# Patient Record
Sex: Male | Born: 1950
Health system: Southern US, Community
[De-identification: ages and names within clinical notes are randomized; demographics above are authoritative.]

## PROBLEM LIST (undated history)

## (undated) DIAGNOSIS — Z86010 Personal history of colonic polyps: Secondary | ICD-10-CM

## (undated) DIAGNOSIS — C801 Malignant (primary) neoplasm, unspecified: Secondary | ICD-10-CM

## (undated) DIAGNOSIS — H269 Unspecified cataract: Secondary | ICD-10-CM

## (undated) DIAGNOSIS — M1990 Unspecified osteoarthritis, unspecified site: Secondary | ICD-10-CM

## (undated) HISTORY — PX: COSMETIC SURGERY: SHX468

## (undated) HISTORY — DX: Unspecified osteoarthritis, unspecified site: M19.90

## (undated) HISTORY — PX: COLONOSCOPY: SHX174

## (undated) HISTORY — DX: Malignant (primary) neoplasm, unspecified: C80.1

## (undated) HISTORY — DX: Unspecified cataract: H26.9

## (undated) HISTORY — PX: FRACTURE SURGERY: SHX138

## (undated) HISTORY — DX: Personal history of colonic polyps: Z86.010

---

## 2001-08-05 ENCOUNTER — Encounter: Payer: Self-pay | Admitting: Internal Medicine

## 2007-06-13 ENCOUNTER — Emergency Department (HOSPITAL_COMMUNITY): Admission: EM | Admit: 2007-06-13 | Discharge: 2007-06-13 | Payer: Self-pay | Admitting: Emergency Medicine

## 2008-07-08 ENCOUNTER — Emergency Department (HOSPITAL_COMMUNITY): Admission: EM | Admit: 2008-07-08 | Discharge: 2008-07-08 | Payer: Self-pay | Admitting: Emergency Medicine

## 2009-06-30 ENCOUNTER — Emergency Department (HOSPITAL_COMMUNITY): Admission: EM | Admit: 2009-06-30 | Discharge: 2009-06-30 | Payer: Self-pay | Admitting: Emergency Medicine

## 2009-07-13 ENCOUNTER — Ambulatory Visit: Payer: Self-pay | Admitting: Internal Medicine

## 2009-07-13 DIAGNOSIS — R5383 Other fatigue: Secondary | ICD-10-CM

## 2009-07-13 DIAGNOSIS — R5381 Other malaise: Secondary | ICD-10-CM | POA: Insufficient documentation

## 2009-07-16 LAB — CONVERTED CEMR LAB
AST: 37 units/L (ref 0–37)
Albumin: 3.7 g/dL (ref 3.5–5.2)
Basophils Absolute: 0 10*3/uL (ref 0.0–0.1)
CO2: 33 meq/L — ABNORMAL HIGH (ref 19–32)
Glucose, Bld: 101 mg/dL — ABNORMAL HIGH (ref 70–99)
HCT: 45.2 % (ref 39.0–52.0)
Hemoglobin: 15.4 g/dL (ref 13.0–17.0)
Lymphs Abs: 1.9 10*3/uL (ref 0.7–4.0)
MCHC: 34.1 g/dL (ref 30.0–36.0)
Monocytes Relative: 9 % (ref 3.0–12.0)
Neutro Abs: 3.1 10*3/uL (ref 1.4–7.7)
Potassium: 4.9 meq/L (ref 3.5–5.1)
RDW: 12.3 % (ref 11.5–14.6)
Sodium: 140 meq/L (ref 135–145)
TSH: 1.48 microintl units/mL (ref 0.35–5.50)
Total Protein: 6.6 g/dL (ref 6.0–8.3)

## 2010-01-04 IMAGING — CT CT HEAD W/O CM
1 of 2 series · 13 of 30 positions shown, 17 images · non-contrast
Comparison: None

CLINICAL DATA: Dizziness.  Weakness.  Slurred speech.

CT HEAD WITHOUT CONTRAST
TECHNIQUE: Contiguous axial images were obtained from the base of
the skull through the vertex without contrast.

[Series 2: brain · axial · 0.47mm/px · z∈[-89,+49]mm · 13 of 32 slices shown, 17 images]
[im 3/32  brain]
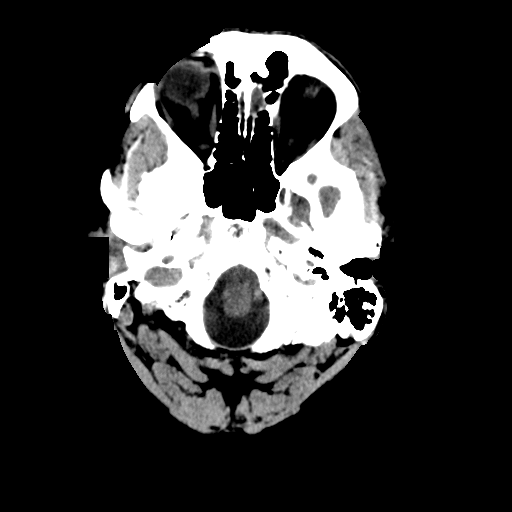
[im 3/32  bone]
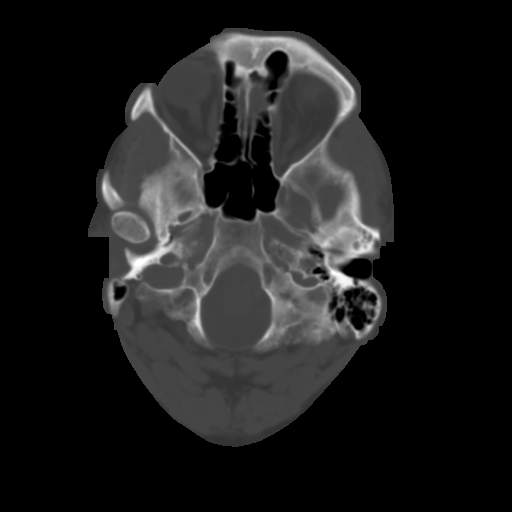
[im 5/32  brain]
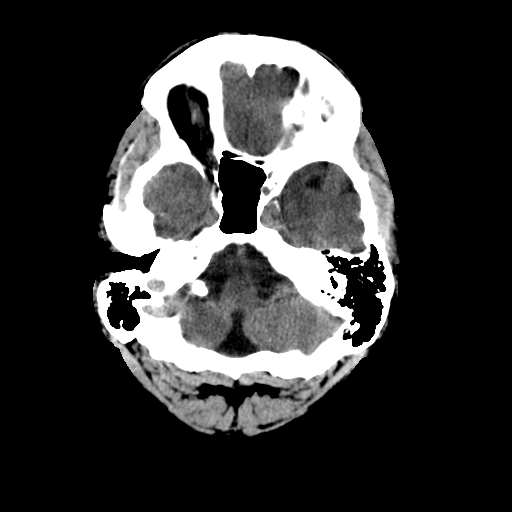
[im 7/32  brain]
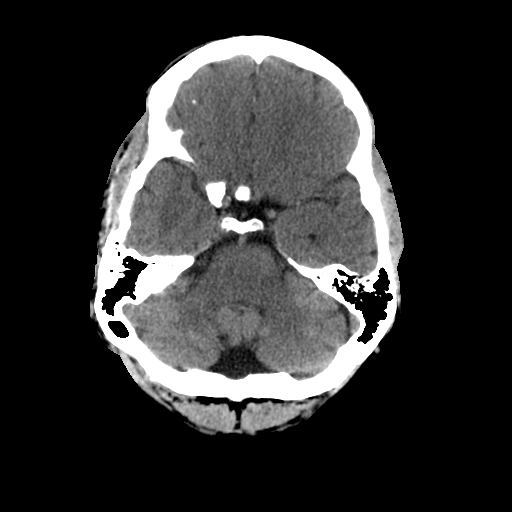
[im 9/32  brain]
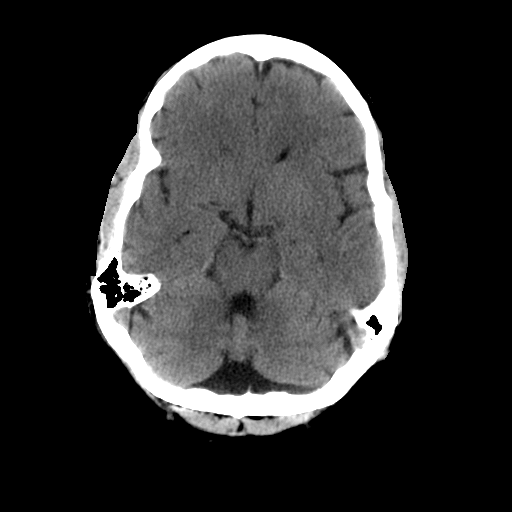
[im 12/32  brain]
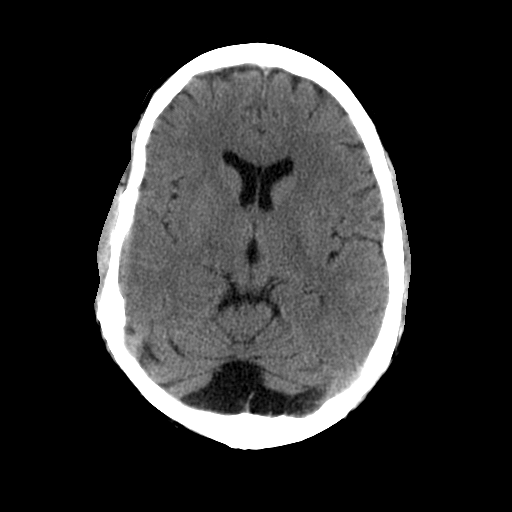
[im 12/32  bone]
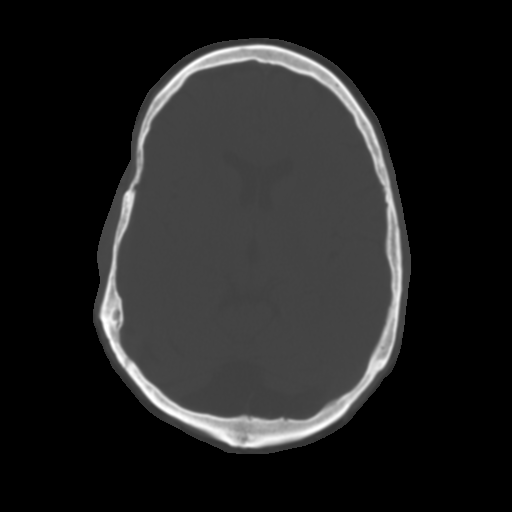
[im 14/32  brain]
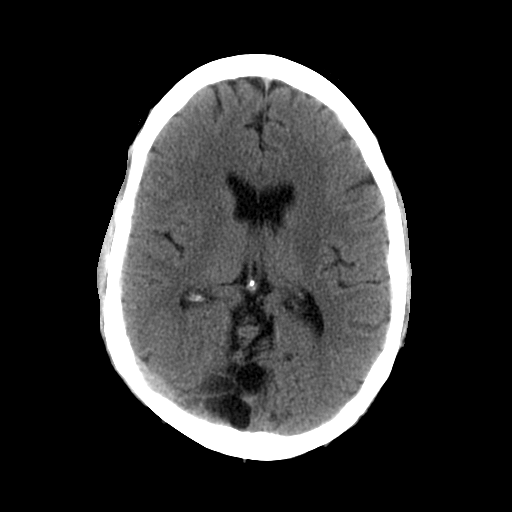
[im 16/32  brain]
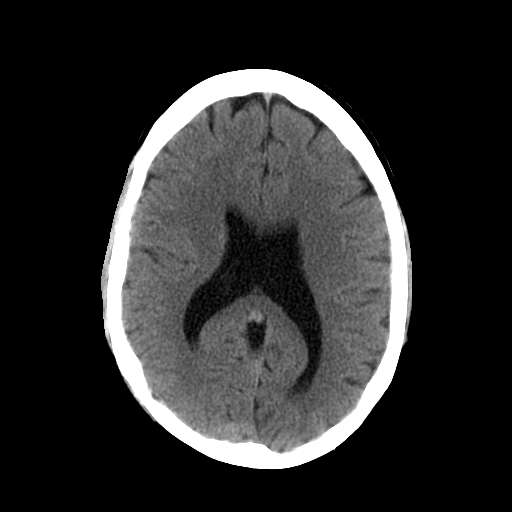
[im 18/32  brain]
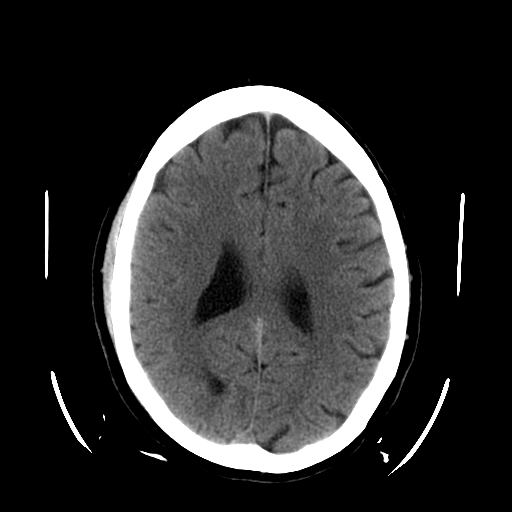
[im 20/32  brain]
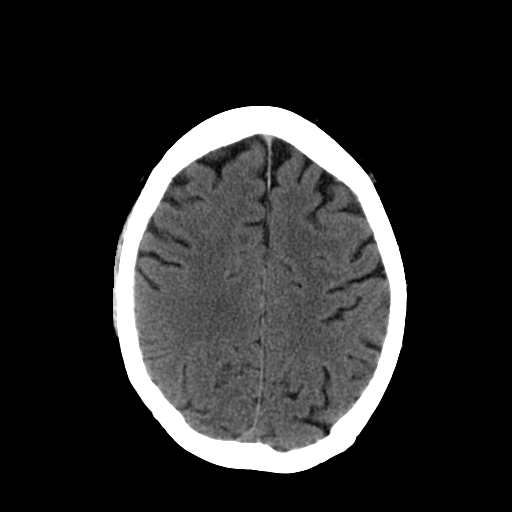
[im 20/32  bone]
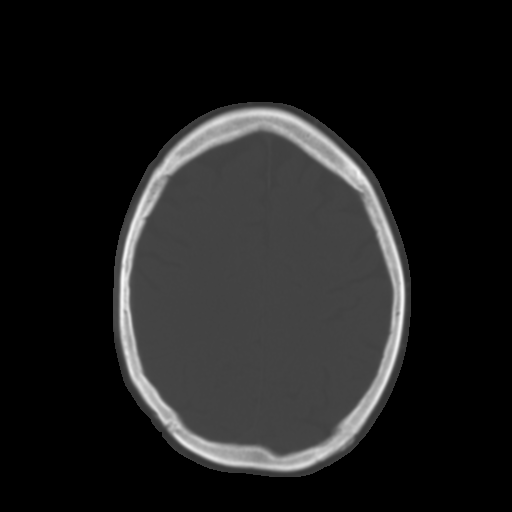
[im 23/32  brain]
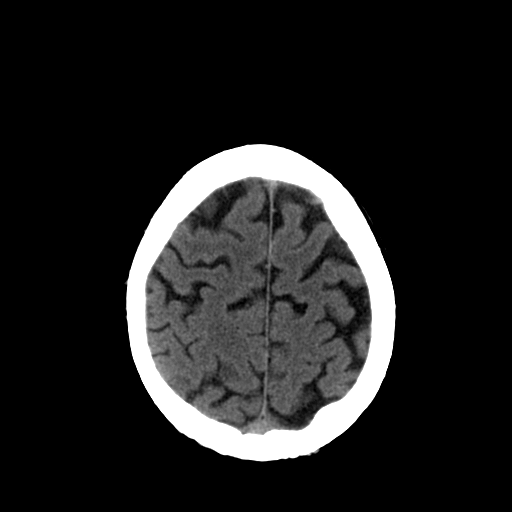
[im 25/32  brain]
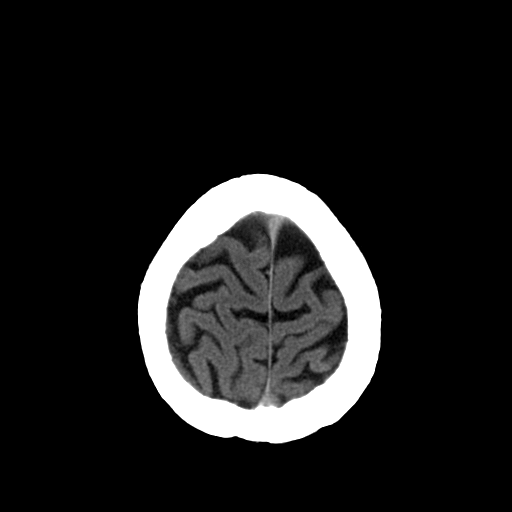
[im 27/32  brain]
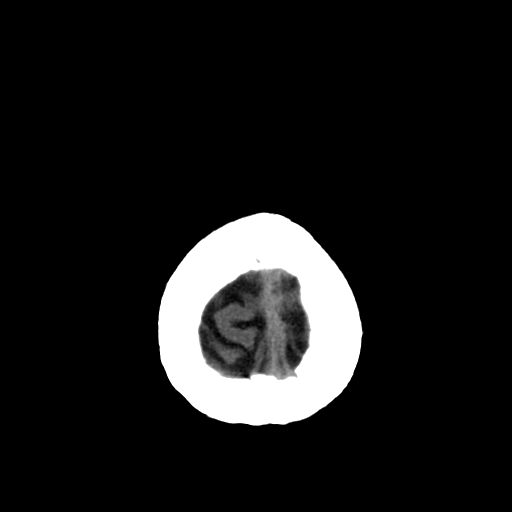
[im 29/32  brain]
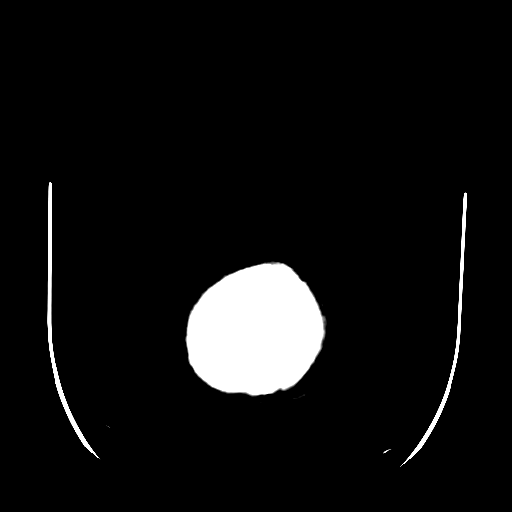
[im 29/32  bone]
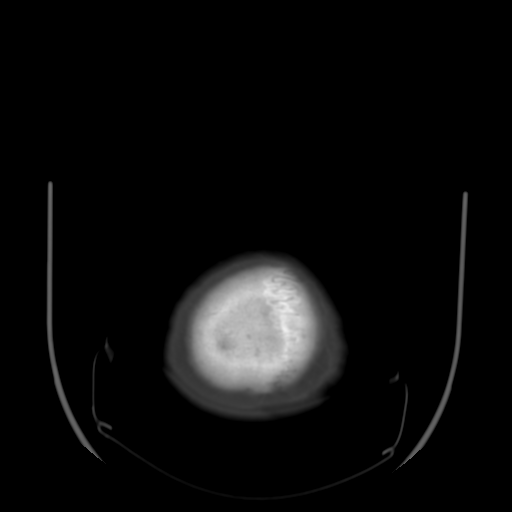

[13 of 30 positions shown; findings below may reference images not displayed]

FINDINGS: There is no evidence of acute infarction, mass lesion,
hemorrhage, hydrocephalus or subdural collection.  The patient has
the congenital variation of a mega cisterna magna, not a
significant finding.  The calvarium is unremarkable.  The sinuses,
middle ears and mastoids are clear.
IMPRESSION: No acute or significant finding.  Examination within normal limits.
See above discussion.

## 2011-01-02 ENCOUNTER — Encounter: Payer: Self-pay | Admitting: Internal Medicine

## 2011-02-08 LAB — POCT I-STAT, CHEM 8
Calcium, Ion: 1.05 mmol/L — ABNORMAL LOW (ref 1.12–1.32)
Glucose, Bld: 176 mg/dL — ABNORMAL HIGH (ref 70–99)
HCT: 42 % (ref 39.0–52.0)
Hemoglobin: 14.3 g/dL (ref 13.0–17.0)
Potassium: 4.7 mEq/L (ref 3.5–5.1)
TCO2: 19 mmol/L (ref 0–100)

## 2011-02-08 LAB — POCT CARDIAC MARKERS
CKMB, poc: 1.6 ng/mL (ref 1.0–8.0)
CKMB, poc: 8.3 ng/mL (ref 1.0–8.0)
Troponin i, poc: 0.06 ng/mL (ref 0.00–0.09)

## 2011-03-18 NOTE — H&P (Signed)
NAME:  CARDALE, DORER NO.:  [id number]   MEDICAL RECORD NO.:  [id number]          PATIENT TYPE:  EMS   LOCATION:  [facility]                         FACILITY:  [facility]   PHYSICIAN:  Valetta Mole. Swords, MD    DATE OF BIRTH:  [birth year]   DATE OF ADMISSION:  06/30/2009  DATE OF DISCHARGE:  06/30/2009                              HISTORY & PHYSICAL   Mr. Delpilar is a 60 year old male while running a 5K today initially felt  well.  At the end of the race, he felt like his legs just quit working.  He fell down and scraped his right knee.  More importantly, his wife and  he noted that his speech was somewhat slurred like my tongue was  thick.  Also admits that his memory was fussy but no loss of  consciousness.  Wife reports left facial drooping.  He does remember  feeling poorly towards the end of the race.  He remembers feeling very  hot.  He does not remember sweating excessively.  He does admit that  this was twice as far as his usual run.   PAST MEDICAL HISTORY:  Unremarkable.   SURGERY:  Nasal surgery.   SOCIAL HISTORY:  He is nonsmoker, married, alcohol 1 glass of wine per  night.  He does exercise regularly.   FAMILY HISTORY:  Mother alive and well, may have had a TIA 10 years ago.  Father deceased from respiratory failure.   REVIEW OF SYSTEMS:  Completely normal except the above and a 15-point  review of systems.   PHYSICAL EXAMINATION:  VITAL SIGNS:  Temperature 98, blood pressure  121/63, heart rate 54, respirations 16.  GENERAL:  He appears well-developed, well-nourished male, in no acute  distress.  HEENT:  Atraumatic, normocephalic.  Extraocular muscles are intact.  Pupils are equally round and reactive to light.  Cranial nerves are  intact.  NECK:  Supple without lymphadenopathy, thyromegaly, jugular venous  distention, or carotid bruits.  CHEST:  Clear to auscultation without any increased work of breathing.  CARDIAC:  S1 and S2 are normal without murmurs  or gallops.  ABDOMEN:  Active bowel sounds, soft, and nontender.  There is no  thyromegaly.  No masses are palpated.  EXTREMITIES:  There is no clubbing.  NEUROLOGIC:  He is alert and oriented without any motor or sensory  deficits.  Deep tendon reflexes are normal in the upper and lower  extremities.  Strength is normal throughout.  Gait is normal.   LABORATORIES:  Cardiac markers normal.  Sodium was 133.  Ionized calcium  1.05, otherwise, I-STAT Chem-8 is normal.  CT of the head demonstrates  no acute finding.  Examination is within normal limits.   ASSESSMENT AND PLAN:  He has had some sort of transient neurologic  event.  I doubt this is transient ischemic attacks.  I suspect this is  more likely related to heat exhaustion.  I think he can go home, but we  will feel more comfortable after he has an MRI/MRA of the brain.  I  discussed with Radiology, they can  do that now.  If this is  unremarkable, he can be discharged home.  Follow up with me in 1-2  weeks.  He is advised to take an aspirin daily.      Bruce Rexene Edison Swords, MD  Electronically Signed     [facility]  D:  06/30/2009  T:  07/01/2009  Job:  [id number]

## 2012-02-02 ENCOUNTER — Ambulatory Visit: Payer: Self-pay | Admitting: Internal Medicine

## 2013-08-19 ENCOUNTER — Telehealth: Payer: Self-pay | Admitting: Family Medicine

## 2013-08-19 NOTE — Telephone Encounter (Signed)
Message copied by Drusilla Kanner on Fri Aug 19, 2013  5:02 PM ------      Message from: Lindley Magnus      Created: Tue Mar 01, 2013  8:02 AM      Regarding: RE: Request to Re-establish      Contact: [phone]       Danielle Dess      ----- Message -----         From: Drusilla Kanner         Sent: 02/25/2013   7:05 AM           To: Lindley Magnus, MD      Subject: Request to Re-establish                                  Pt last seen over 4 years ago and is requesting to re-establish.  I explained that you are currently not accepting new patients at this time due to your reduced work schedule.  Pt requested a message be sent to you asking if you would make an exception.  If not pt requests a recommendation from you on who he should see.            Thanks      [place]       ------

## 2013-08-19 NOTE — Telephone Encounter (Signed)
Pt has an appt to establish with Dr. Caryl Never on 10/13/13.  However, needs a colonoscopy before the end of the year and pt knows that it can take several months to get in with a specialist.  Please advise if ok to order referral for colonoscopy prior to appt to re-establish.

## 2013-08-21 NOTE — Telephone Encounter (Signed)
I'm OK to refer but my only concern is whether insurance would deny referral if he is consider not established.

## 2013-08-24 ENCOUNTER — Other Ambulatory Visit: Payer: Self-pay | Admitting: Family Medicine

## 2013-08-24 DIAGNOSIS — Z0000 Encounter for general adult medical examination without abnormal findings: Secondary | ICD-10-CM

## 2013-08-24 NOTE — Telephone Encounter (Signed)
Order is placed.

## 2013-08-24 NOTE — Telephone Encounter (Signed)
Will notify pt, Montrice please enter order for referral

## 2013-09-05 ENCOUNTER — Telehealth: Payer: Self-pay | Admitting: Family Medicine

## 2013-09-05 NOTE — Telephone Encounter (Signed)
I dont see any medications on the patient list. And i don't see where he even saw Dr. Cato Mulligan. Is it okay to send a RX for a EpiPen for the patient

## 2013-09-05 NOTE — Telephone Encounter (Signed)
Go ahead and give one rx for Epipen 0.3 mg use as directed prn and make sure he keeps appt in December.

## 2013-09-05 NOTE — Telephone Encounter (Signed)
Left message for Consuella Lose to give a call back to the office

## 2013-09-05 NOTE — Telephone Encounter (Signed)
Pt will be new pt in dec. Over 3 yrs since seen dr swords. Pt needs rx for epipen. 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 pls advise Pharm: [facility]/ [address]

## 2013-09-06 MED ORDER — EPINEPHRINE 0.3 MG/0.3ML IJ SOAJ: 0.3000 mg | Freq: Once | INTRAMUSCULAR | Status: AC

## 2013-09-06 NOTE — Telephone Encounter (Signed)
Sent RX to pharmacy 

## 2013-10-07 ENCOUNTER — Other Ambulatory Visit (INDEPENDENT_AMBULATORY_CARE_PROVIDER_SITE_OTHER): Payer: BC Managed Care – PPO

## 2013-10-07 DIAGNOSIS — Z0000 Encounter for general adult medical examination without abnormal findings: Secondary | ICD-10-CM

## 2013-10-07 LAB — CBC WITH DIFFERENTIAL/PLATELET
Basophils Relative: 1 % (ref 0.0–3.0)
Eosinophils Relative: 5.1 % — ABNORMAL HIGH (ref 0.0–5.0)
HCT: 45.5 % (ref 39.0–52.0)
Hemoglobin: 15.8 g/dL (ref 13.0–17.0)
Lymphs Abs: 2.1 10*3/uL (ref 0.7–4.0)
MCV: 91.9 fl (ref 78.0–100.0)
Monocytes Absolute: 1 10*3/uL (ref 0.1–1.0)
Monocytes Relative: 15.7 % — ABNORMAL HIGH (ref 3.0–12.0)
RBC: 4.95 Mil/uL (ref 4.22–5.81)
WBC: 6.6 10*3/uL (ref 4.5–10.5)

## 2013-10-07 LAB — BASIC METABOLIC PANEL
Chloride: 101 mEq/L (ref 96–112)
GFR: 78.16 mL/min (ref >60.00)
Potassium: 5.3 mEq/L — ABNORMAL HIGH (ref 3.5–5.1)
Sodium: 136 mEq/L (ref 135–145)

## 2013-10-07 LAB — LIPID PANEL
HDL: 51.9 mg/dL (ref >39.00)
LDL Cholesterol: 113 mg/dL — ABNORMAL HIGH (ref 0–99)
Total CHOL/HDL Ratio: 4
Triglycerides: 159 mg/dL — ABNORMAL HIGH (ref 0.0–149.0)

## 2013-10-07 LAB — TSH: TSH: 2.09 u[IU]/mL (ref 0.35–5.50)

## 2013-10-07 LAB — POCT URINALYSIS DIPSTICK
Bilirubin, UA: NEGATIVE
Blood, UA: NEGATIVE
Glucose, UA: NEGATIVE
Nitrite, UA: NEGATIVE
Spec Grav, UA: 1.015
Urobilinogen, UA: 0.2
pH, UA: 7

## 2013-10-07 LAB — HEPATIC FUNCTION PANEL
ALT: 30 U/L (ref 0–53)
AST: 27 U/L (ref 0–37)
Total Protein: 6.5 g/dL (ref 6.0–8.3)

## 2013-10-07 LAB — PSA: PSA: 0.73 ng/mL (ref 0.10–4.00)

## 2013-10-13 ENCOUNTER — Encounter: Payer: Self-pay | Admitting: Family Medicine

## 2013-10-13 ENCOUNTER — Ambulatory Visit (INDEPENDENT_AMBULATORY_CARE_PROVIDER_SITE_OTHER): Payer: BC Managed Care – PPO | Admitting: Family Medicine

## 2013-10-13 VITALS — BP 154/80 | HR 69 | Temp 98.4°F | Wt 210.0 lb

## 2013-10-13 DIAGNOSIS — Z23 Encounter for immunization: Secondary | ICD-10-CM

## 2013-10-13 DIAGNOSIS — Z0000 Encounter for general adult medical examination without abnormal findings: Secondary | ICD-10-CM

## 2013-10-13 MED ORDER — TERBINAFINE HCL 250 MG PO TABS: 250.0000 mg | ORAL_TABLET | Freq: Every day | ORAL | Status: DC

## 2013-10-13 NOTE — Progress Notes (Signed)
Pre visit review using our clinic review tool, if applicable. No additional management support is needed unless otherwise documented below in the visit note. 

## 2013-10-13 NOTE — Patient Instructions (Signed)
Hypertriglyceridemia  Diet for High blood levels of Triglycerides Most fats in food are triglycerides. Triglycerides in your blood are stored as fat in your body. High levels of triglycerides in your blood may put you at a greater risk for heart disease and stroke.  Normal triglyceride levels are less than 150 mg/dL. Borderline high levels are 150-199 mg/dl. High levels are 200 - 499 mg/dL, and very high triglyceride levels are greater than 500 mg/dL. The decision to treat high triglycerides is generally based on the level. For people with borderline or high triglyceride levels, treatment includes weight loss and exercise. Drugs are recommended for people with very high triglyceride levels. Many people who need treatment for high triglyceride levels have metabolic syndrome. This syndrome is a collection of disorders that often include: insulin resistance, high blood pressure, blood clotting problems, high cholesterol and triglycerides. TESTING PROCEDURE FOR TRIGLYCERIDES  You should not eat 4 hours before getting your triglycerides measured. The normal range of triglycerides is between 10 and 250 milligrams per deciliter (mg/dl). Some people may have extreme levels (1000 or above), but your triglyceride level may be too high if it is above 150 mg/dl, depending on what other risk factors you have for heart disease.  People with high blood triglycerides may also have high blood cholesterol levels. If you have high blood cholesterol as well as high blood triglycerides, your risk for heart disease is probably greater than if you only had high triglycerides. High blood cholesterol is one of the main risk factors for heart disease. CHANGING YOUR DIET  Your weight can affect your blood triglyceride level. If you are more than 20% above your ideal body weight, you may be able to lower your blood triglycerides by losing weight. Eating less and exercising regularly is the best way to combat this. Fat provides more  calories than any other food. The best way to lose weight is to eat less fat. Only 30% of your total calories should come from fat. Less than 7% of your diet should come from saturated fat. A diet low in fat and saturated fat is the same as a diet to decrease blood cholesterol. By eating a diet lower in fat, you may lose weight, lower your blood cholesterol, and lower your blood triglyceride level.  Eating a diet low in fat, especially saturated fat, may also help you lower your blood triglyceride level. Ask your dietitian to help you figure how much fat you can eat based on the number of calories your caregiver has prescribed for you.  Exercise, in addition to helping with weight loss may also help lower triglyceride levels.   Alcohol can increase blood triglycerides. You may need to stop drinking alcoholic beverages.  Too much carbohydrate in your diet may also increase your blood triglycerides. Some complex carbohydrates are necessary in your diet. These may include bread, rice, potatoes, other starchy vegetables and cereals.  Reduce "simple" carbohydrates. These may include pure sugars, candy, honey, and jelly without losing other nutrients. If you have the kind of high blood triglycerides that is affected by the amount of carbohydrates in your diet, you will need to eat less sugar and less high-sugar foods. Your caregiver can help you with this.  Adding 2-4 grams of fish oil (EPA+ DHA) may also help lower triglycerides. Speak with your caregiver before adding any supplements to your regimen. Following the Diet  Maintain your ideal weight. Your caregivers can help you with a diet. Generally, eating less food and getting more   exercise will help you lose weight. Joining a weight control group may also help. Ask your caregivers for a good weight control group in your area.  Eat low-fat foods instead of high-fat foods. This can help you lose weight too.  These foods are lower in fat. Eat MORE of these:    Dried beans, peas, and lentils.  Egg whites.  Low-fat cottage cheese.  Fish.  Lean cuts of meat, such as round, sirloin, rump, and flank (cut extra fat off meat you fix).  Whole grain breads, cereals and pasta.  Skim and nonfat dry milk.  Low-fat yogurt.  Poultry without the skin.  Cheese made with skim or part-skim milk, such as mozzarella, parmesan, farmers', ricotta, or pot cheese. These are higher fat foods. Eat LESS of these:   Whole milk and foods made from whole milk, such as American, blue, cheddar, monterey jack, and swiss cheese  High-fat meats, such as luncheon meats, sausages, knockwurst, bratwurst, hot dogs, ribs, corned beef, ground pork, and regular ground beef.  Fried foods. Limit saturated fats in your diet. Substituting unsaturated fat for saturated fat may decrease your blood triglyceride level. You will need to read package labels to know which products contain saturated fats.  These foods are high in saturated fat. Eat LESS of these:   Fried pork skins.  Whole milk.  Skin and fat from poultry.  Palm oil.  Butter.  Shortening.  Cream cheese.  Bacon.  Margarines and baked goods made from listed oils.  Vegetable shortenings.  Chitterlings.  Fat from meats.  Coconut oil.  Palm kernel oil.  Lard.  Cream.  Sour cream.  Fatback.  Coffee whiteners and non-dairy creamers made with these oils.  Cheese made from whole milk. Use unsaturated fats (both polyunsaturated and monounsaturated) moderately. Remember, even though unsaturated fats are better than saturated fats; you still want a diet low in total fat.  These foods are high in unsaturated fat:   Canola oil.  Sunflower oil.  Mayonnaise.  Almonds.  Peanuts.  Pine nuts.  Margarines made with these oils.  Safflower oil.  Olive oil.  Avocados.  Cashews.  Peanut butter.  Sunflower seeds.  Soybean oil.  Peanut  oil.  Olives.  Pecans.  Walnuts.  Pumpkin seeds. Avoid sugar and other high-sugar foods. This will decrease carbohydrates without decreasing other nutrients. Sugar in your food goes rapidly to your blood. When there is excess sugar in your blood, your liver may use it to make more triglycerides. Sugar also contains calories without other important nutrients.  Eat LESS of these:   Sugar, brown sugar, powdered sugar, jam, jelly, preserves, honey, syrup, molasses, pies, candy, cakes, cookies, frosting, pastries, colas, soft drinks, punches, fruit drinks, and regular gelatin.  Avoid alcohol. Alcohol, even more than sugar, may increase blood triglycerides. In addition, alcohol is high in calories and low in nutrients. Ask for sparkling water, or a diet soft drink instead of an alcoholic beverage. Suggestions for planning and preparing meals   Bake, broil, grill or roast meats instead of frying.  Remove fat from meats and skin from poultry before cooking.  Add spices, herbs, lemon juice or vinegar to vegetables instead of salt, rich sauces or gravies.  Use a non-stick skillet without fat or use no-stick sprays.  Cool and refrigerate stews and broth. Then remove the hardened fat floating on the surface before serving.  Refrigerate meat drippings and skim off fat to make low-fat gravies.  Serve more fish.  Use less butter,   margarine and other high-fat spreads on bread or vegetables.  Use skim or reconstituted non-fat dry milk for cooking.  Cook with low-fat cheeses.  Substitute low-fat yogurt or cottage cheese for all or part of the sour cream in recipes for sauces, dips or congealed salads.  Use half yogurt/half mayonnaise in salad recipes.  Substitute evaporated skim milk for cream. Evaporated skim milk or reconstituted non-fat dry milk can be whipped and substituted for whipped cream in certain recipes.  Choose fresh fruits for dessert instead of high-fat foods such as pies or  cakes. Fruits are naturally low in fat. When Dining Out   Order low-fat appetizers such as fruit or vegetable juice, pasta with vegetables or tomato sauce.  Select clear, rather than cream soups.  Ask that dressings and gravies be served on the side. Then use less of them.  Order foods that are baked, broiled, poached, steamed, stir-fried, or roasted.  Ask for margarine instead of butter, and use only a small amount.  Drink sparkling water, unsweetened tea or coffee, or diet soft drinks instead of alcohol or other sweet beverages. QUESTIONS AND ANSWERS ABOUT OTHER FATS IN THE BLOOD: SATURATED FAT, TRANS FAT, AND CHOLESTEROL What is trans fat? Trans fat is a type of fat that is formed when vegetable oil is hardened through a process called hydrogenation. This process helps makes foods more solid, gives them shape, and prolongs their shelf life. Trans fats are also called hydrogenated or partially hydrogenated oils.  What do saturated fat, trans fat, and cholesterol in foods have to do with heart disease? Saturated fat, trans fat, and cholesterol in the diet all raise the level of LDL "bad" cholesterol in the blood. The higher the LDL cholesterol, the greater the risk for coronary heart disease (CHD). Saturated fat and trans fat raise LDL similarly.  What foods contain saturated fat, trans fat, and cholesterol? High amounts of saturated fat are found in animal products, such as fatty cuts of meat, chicken skin, and full-fat dairy products like butter, whole milk, cream, and cheese, and in tropical vegetable oils such as palm, palm kernel, and coconut oil. Trans fat is found in some of the same foods as saturated fat, such as vegetable shortening, some margarines (especially hard or stick margarine), crackers, cookies, baked goods, fried foods, salad dressings, and other processed foods made with partially hydrogenated vegetable oils. Small amounts of trans fat also occur naturally in some animal  products, such as milk products, beef, and lamb. Foods high in cholesterol include liver, other organ meats, egg yolks, shrimp, and full-fat dairy products. How can I use the new food label to make heart-healthy food choices? Check the Nutrition Facts panel of the food label. Choose foods lower in saturated fat, trans fat, and cholesterol. For saturated fat and cholesterol, you can also use the Percent Daily Value (%DV): 5% DV or less is low, and 20% DV or more is high. (There is no %DV for trans fat.) Use the Nutrition Facts panel to choose foods low in saturated fat and cholesterol, and if the trans fat is not listed, read the ingredients and limit products that list shortening or hydrogenated or partially hydrogenated vegetable oil, which tend to be high in trans fat. POINTS TO REMEMBER:   Discuss your risk for heart disease with your caregivers, and take steps to reduce risk factors.  Change your diet. Choose foods that are low in saturated fat, trans fat, and cholesterol.  Add exercise to your daily routine if   it is not already being done. Participate in physical activity of moderate intensity, like brisk walking, for at least 30 minutes on most, and preferably all days of the week. No time? Break the 30 minutes into three, 10-minute segments during the day.  Stop smoking. If you do smoke, contact your caregiver to discuss ways in which they can help you quit.  Do not use street drugs.  Maintain a normal weight.  Maintain a healthy blood pressure.  Keep up with your blood work for checking the fats in your blood as directed by your caregiver. Document Released: 08/07/2004 Document Revised: 04/20/2012 Document Reviewed: 03/05/2009 [facility] Patient Information 2014 [place]. Fat and Cholesterol Control Diet Fat and cholesterol levels in your blood and organs are influenced by your diet. High levels of fat and cholesterol may lead to diseases of the heart, small and large blood  vessels, gallbladder, liver, and pancreas. CONTROLLING FAT AND CHOLESTEROL WITH DIET Although exercise and lifestyle factors are important, your diet is key. That is because certain foods are known to raise cholesterol and others to lower it. The goal is to balance foods for their effect on cholesterol and more importantly, to replace saturated and trans fat with other types of fat, such as monounsaturated fat, polyunsaturated fat, and omega-3 fatty acids. On average, a person should consume no more than 15 to 17 g of saturated fat daily. Saturated and trans fats are considered "bad" fats, and they will raise LDL cholesterol. Saturated fats are primarily found in animal products such as meats, butter, and cream. However, that does not mean you need to give up all your favorite foods. Today, there are good tasting, low-fat, low-cholesterol substitutes for most of the things you like to eat. Choose low-fat or nonfat alternatives. Choose round or loin cuts of red meat. These types of cuts are lowest in fat and cholesterol. Chicken (without the skin), fish, veal, and ground [place] breast are great choices. Eliminate fatty meats, such as hot dogs and salami. Even shellfish have little or no saturated fat. Have a 3 oz (85 g) portion when you eat lean meat, poultry, or fish. Trans fats are also called "partially hydrogenated oils." They are oils that have been scientifically manipulated so that they are solid at room temperature resulting in a longer shelf life and improved taste and texture of foods in which they are added. Trans fats are found in stick margarine, some tub margarines, cookies, crackers, and baked goods.  When baking and cooking, oils are a great substitute for butter. The monounsaturated oils are especially beneficial since it is believed they lower LDL and raise HDL. The oils you should avoid entirely are saturated tropical oils, such as coconut and palm.  Remember to eat a lot from food groups  that are naturally free of saturated and trans fat, including fish, fruit, vegetables, beans, grains (barley, rice, couscous, bulgur wheat), and pasta (without cream sauces).  IDENTIFYING FOODS THAT LOWER FAT AND CHOLESTEROL  Soluble fiber may lower your cholesterol. This type of fiber is found in fruits such as apples, vegetables such as broccoli, potatoes, and carrots, legumes such as beans, peas, and lentils, and grains such as barley. Foods fortified with plant sterols (phytosterol) may also lower cholesterol. You should eat at least 2 g per day of these foods for a cholesterol lowering effect.  Read package labels to identify low-saturated fats, trans fat free, and low-fat foods at the supermarket. Select cheeses that have only 2 to 3 g  saturated fat per ounce. Use a heart-healthy tub margarine that is free of trans fats or partially hydrogenated oil. When buying baked goods (cookies, crackers), avoid partially hydrogenated oils. Breads and muffins should be made from whole grains (whole-wheat or whole oat flour, instead of "flour" or "enriched flour"). Buy non-creamy canned soups with reduced salt and no added fats.  FOOD PREPARATION TECHNIQUES  Never deep-fry. If you must fry, either stir-fry, which uses very little fat, or use non-stick cooking sprays. When possible, broil, bake, or roast meats, and steam vegetables. Instead of putting butter or margarine on vegetables, use lemon and herbs, applesauce, and cinnamon (for squash and sweet potatoes). Use nonfat yogurt, salsa, and low-fat dressings for salads.  LOW-SATURATED FAT / LOW-FAT FOOD SUBSTITUTES Meats / Saturated Fat (g)  Avoid: Steak, marbled (3 oz/85 g) / 11 g  Choose: Steak, lean (3 oz/85 g) / 4 g  Avoid: Hamburger (3 oz/85 g) / 7 g  Choose: Hamburger, lean (3 oz/85 g) / 5 g  Avoid: Ham (3 oz/85 g) / 6 g  Choose: Ham, lean cut (3 oz/85 g) / 2.4 g  Avoid: Chicken, with skin, dark meat (3 oz/85 g) / 4 g  Choose: Chicken, skin  removed, dark meat (3 oz/85 g) / 2 g  Avoid: Chicken, with skin, light meat (3 oz/85 g) / 2.5 g  Choose: Chicken, skin removed, light meat (3 oz/85 g) / 1 g Dairy / Saturated Fat (g)  Avoid: Whole milk (1 cup) / 5 g  Choose: Low-fat milk, 2% (1 cup) / 3 g  Choose: Low-fat milk, 1% (1 cup) / 1.5 g  Choose: Skim milk (1 cup) / 0.3 g  Avoid: Hard cheese (1 oz/28 g) / 6 g  Choose: Skim milk cheese (1 oz/28 g) / 2 to 3 g  Avoid: Cottage cheese, 4% fat (1 cup) / 6.5 g  Choose: Low-fat cottage cheese, 1% fat (1 cup) / 1.5 g  Avoid: Ice cream (1 cup) / 9 g  Choose: Sherbet (1 cup) / 2.5 g  Choose: Nonfat frozen yogurt (1 cup) / 0.3 g  Choose: Frozen fruit bar / trace  Avoid: Whipped cream (1 tbs) / 3.5 g  Choose: Nondairy whipped topping (1 tbs) / 1 g Condiments / Saturated Fat (g)  Avoid: Mayonnaise (1 tbs) / 2 g  Choose: Low-fat mayonnaise (1 tbs) / 1 g  Avoid: Butter (1 tbs) / 7 g  Choose: Extra light margarine (1 tbs) / 1 g  Avoid: Coconut oil (1 tbs) / 11.8 g  Choose: Olive oil (1 tbs) / 1.8 g  Choose: Corn oil (1 tbs) / 1.7 g  Choose: Safflower oil (1 tbs) / 1.2 g  Choose: Sunflower oil (1 tbs) / 1.4 g  Choose: Soybean oil (1 tbs) / 2.4 g  Choose: Canola oil (1 tbs) / 1 g Document Released: 10/20/2005 Document Revised: 02/14/2013 Document Reviewed: 04/10/2011 [employer] Patient Information 2014 [place], [place].  Lose some weight and let's plan to recheck blood pressure in 3 months Try to limit sodium to no more than 2 g daily Limit wine to no more than 12 ounces daily

## 2013-10-13 NOTE — Progress Notes (Signed)
   Subjective:    Patient ID: Mark Aguilar, male    DOB: 09-Jul-1951, 62 y.o.   MRN: [id number]  HPI Patient seen to establish care Previous has been seen in this clinic but not for over 3 years. He is here requesting wellness visit. He currently takes no medications. 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  He does not exercise. Nonsmoker. No history of screening colonoscopy. Last tetanus unknown. No history of shingles vaccine.  His mother had history of hypertension. Father died of some type of chronic lung disease and he is not sure which type.  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  Past Medical History  Diagnosis Date  . Arthritis   . Cancer     Skin   Past Surgical History  Procedure Laterality Date  . Cosmetic surgery      Skin  . Fracture surgery      nose    reports that he has quit smoking. His smoking use included Cigars. He does not have any smokeless tobacco history on file. He reports that he drinks alcohol. He reports that he does not use illicit drugs. family history includes Arthritis in his mother; Hypertension in his mother. Allergies  Allergen Reactions  . Bee Venom       Review of Systems  Constitutional: Negative for fever, activity change, appetite change and fatigue.  HENT: Negative for congestion, ear pain and trouble swallowing.   Eyes: Negative for pain and visual disturbance.  Respiratory: Negative for cough, shortness of breath and wheezing.   Cardiovascular: Negative for chest pain and palpitations.  Gastrointestinal: Negative for nausea, vomiting, abdominal pain, diarrhea, constipation, blood in stool, abdominal distention and rectal pain.  Genitourinary: Negative for dysuria, hematuria and testicular pain.  Musculoskeletal: Negative for arthralgias and joint swelling.  Skin: Negative for rash.  Neurological: Negative for dizziness, syncope and headaches.  Hematological: Negative for adenopathy.    Psychiatric/Behavioral: Negative for confusion and dysphoric mood.       Objective:   Physical Exam  Constitutional: He is oriented to person, place, and time. He appears well-developed and well-nourished. No distress.  HENT:  Head: Normocephalic and atraumatic.  Right Ear: External ear normal.  Left Ear: External ear normal.  Mouth/Throat: Oropharynx is clear and moist.  Eyes: Conjunctivae and EOM are normal. Pupils are equal, round, and reactive to light.  Neck: Normal range of motion. Neck supple. No thyromegaly present.  Cardiovascular: Normal rate, regular rhythm and normal heart sounds.   No murmur heard. Pulmonary/Chest: No respiratory distress. He has no wheezes. He has no rales.  Abdominal: Soft. Bowel sounds are normal. He exhibits no distension and no mass. There is no tenderness. There is no rebound and no guarding.  Genitourinary: Rectum normal and prostate normal.  Musculoskeletal: He exhibits no edema.  Lymphadenopathy:    He has no cervical adenopathy.  Neurological: He is alert and oriented to person, place, and time. He displays normal reflexes. No cranial nerve deficit.  Skin: No rash noted.  Psychiatric: He has a normal mood and affect.          Assessment & Plan:  Complete physical. Several issues addressed. Needs to lose some weight. Blood pressure is mildly elevated today with no history of hypertension. Check on coverage for shingles vaccine. Schedule screening colonoscopy. Tetanus given. Labs reviewed. Mild dyslipidemia. Lose weight and bring back to reassess blood pressure in 3 months

## 2013-10-14 ENCOUNTER — Encounter: Payer: Self-pay | Admitting: Internal Medicine

## 2013-12-09 ENCOUNTER — Ambulatory Visit (AMBULATORY_SURGERY_CENTER): Payer: Self-pay | Admitting: *Deleted

## 2013-12-09 VITALS — Ht 72.0 in | Wt 200.6 lb

## 2013-12-09 DIAGNOSIS — Z1211 Encounter for screening for malignant neoplasm of colon: Secondary | ICD-10-CM

## 2013-12-09 MED ORDER — NA SULFATE-K SULFATE-MG SULF 17.5-3.13-1.6 GM/177ML PO SOLN: 1.0000 | Freq: Once | ORAL | Status: DC

## 2013-12-09 NOTE — Progress Notes (Signed)
No egg or soy allergy. ewm No home 02 or CPAP use. ewm emmi video sent to email. ewm No problems with past sedation. ewm

## 2013-12-14 ENCOUNTER — Encounter: Payer: Self-pay | Admitting: Internal Medicine

## 2013-12-23 ENCOUNTER — Ambulatory Visit (AMBULATORY_SURGERY_CENTER): Payer: 59 | Admitting: Internal Medicine

## 2013-12-23 ENCOUNTER — Encounter: Payer: Self-pay | Admitting: Internal Medicine

## 2013-12-23 VITALS — BP 111/58 | HR 48 | Temp 96.6°F | Resp 12 | Ht 72.0 in | Wt 200.0 lb

## 2013-12-23 DIAGNOSIS — D126 Benign neoplasm of colon, unspecified: Secondary | ICD-10-CM

## 2013-12-23 DIAGNOSIS — Z1211 Encounter for screening for malignant neoplasm of colon: Secondary | ICD-10-CM

## 2013-12-23 DIAGNOSIS — Z860100 Personal history of colon polyps, unspecified: Secondary | ICD-10-CM

## 2013-12-23 HISTORY — DX: Benign neoplasm of colon, unspecified: D12.6

## 2013-12-23 HISTORY — DX: Personal history of colon polyps, unspecified: Z86.0100

## 2013-12-23 HISTORY — DX: Personal history of colonic polyps: Z86.010

## 2013-12-23 MED ORDER — SODIUM CHLORIDE 0.9 % IV SOLN: 500.0000 mL | INTRAVENOUS | Status: DC

## 2013-12-23 NOTE — Progress Notes (Signed)
Called to room to assist during endoscopic procedure.  Patient ID and intended procedure confirmed with present staff. Received instructions for my participation in the procedure from the performing physician.  

## 2013-12-23 NOTE — Op Note (Signed)
[place]  [address] [place], [id number]   COLONOSCOPY PROCEDURE REPORT  PATIENT: Mark Aguilar, Mark Aguilar  MR#: [id number] BIRTHDATE: 09/16/1951 , 62  yrs. old GENDER: Male ENDOSCOPIST: Gatha Mayer, MD, [facility] REFERRED RA:QTMAU Elease Hashimoto, M.D. PROCEDURE DATE:  12/23/2013 PROCEDURE:   Colonoscopy with biopsy and snare polypectomy First Screening Colonoscopy - Avg.  risk and is 50 yrs.  old or older Yes.  Prior Negative Screening - Now for repeat screening. N/A  History of Adenoma - Now for follow-up colonoscopy & has been > or = to 3 yrs.  N/A  Polyps Removed Today? Yes. ASA CLASS:   Class I INDICATIONS:average risk screening and first colonoscopy. MEDICATIONS: Propofol (Diprivan) 370 mg IV, MAC sedation, administered by CRNA, and These medications were titrated to patient response per physician's verbal order  DESCRIPTION OF PROCEDURE:   After the risks benefits and alternatives of the procedure were thoroughly explained, informed consent was obtained.  A digital rectal exam revealed no abnormalities of the rectum, A digital rectal exam revealed no prostatic nodules, and A digital rectal exam revealed the prostate was not enlarged.   The LB [id number]  endoscope was introduced through the anus and advanced to the cecum, which was identified by both the appendix and ileocecal valve. No adverse events experienced.   The quality of the prep was Suprep good  The instrument was then slowly withdrawn as the colon was fully examined.      COLON FINDINGS: Seven sessile polyps measuring 2-15 mm in size were found in the ascending colon, transverse colon, at the splenic flexure, in the descending colon, and sigmoid colon.  A polypectomy was performed with cold forceps 0r with a cold snare or using snare cautery (15 mm polyp)  The resection was complete and the polyp tissue was completely retrieved.   The colon mucosa was otherwise normal.   A right colon  retroflexion was performed.  Retroflexed views revealed no abnormalities. The time to cecum=6 minutes 42 seconds.  Withdrawal time=18 minutes 59 seconds.  The scope was withdrawn and the procedure completed. COMPLICATIONS: There were no complications.  ENDOSCOPIC IMPRESSION: 1.   Seven sessile polyps measuring 2-15 mm in size were found in the ascending colon, transverse colon, at the splenic flexure, in the descending colon, and sigmoid colon; polypectomy was performed with cold forceps, with a cold snare and using snare cautery 2.   The colon mucosa was otherwise normal - good prep  RECOMMENDATIONS: 1.  Hold aspirin, aspirin products, and anti-inflammatory medication for 2 weeks. 2.  Timing of repeat colonoscopy will be determined by pathology findings.   eSigned:  Gatha Mayer, MD, [facility] 12/23/2013 8:42 AM   cc: Carolann Littler, MD and The Patient   PATIENT NAME:  [name], [name] MR#: [id number]

## 2013-12-23 NOTE — Patient Instructions (Addendum)
I found and removed 7 polyps today. They all look benign. Your prostate was normal. Everything else looked normal, you had a good prep.  I will let you know pathology results and when to have another routine colonoscopy by mail. Estimate is 3 years from now.  I appreciate the opportunity to care for you. Gatha Mayer, MD, FACG  YOU HAD AN ENDOSCOPIC PROCEDURE TODAY AT [facility]: Refer to the procedure report that was given to you for any specific questions about what was found during the examination.  If the procedure report does not answer your questions, please call your gastroenterologist to clarify.  If you requested that your care partner not be given the details of your procedure findings, then the procedure report has been included in a sealed envelope for you to review at your convenience later.  YOU SHOULD EXPECT: Some feelings of bloating in the abdomen. Passage of more gas than usual.  Walking can help get rid of the air that was put into your GI tract during the procedure and reduce the bloating. If you had a lower endoscopy (such as a colonoscopy or flexible sigmoidoscopy) you may notice spotting of blood in your stool or on the toilet paper. If you underwent a bowel prep for your procedure, then you may not have a normal bowel movement for a few days.  DIET: Your first meal following the procedure should be a light meal and then it is ok to progress to your normal diet.  A half-sandwich or bowl of soup is an example of a good first meal.  Heavy or fried foods are harder to digest and may make you feel nauseous or bloated.  Likewise meals heavy in dairy and vegetables can cause extra gas to form and this can also increase the bloating.  Drink plenty of fluids but you should avoid alcoholic beverages for 24 hours.  ACTIVITY: Your care partner should take you home directly after the procedure.  You should plan to take it easy, moving slowly for the rest of the day.   You can resume normal activity the day after the procedure however you should NOT DRIVE or use heavy machinery for 24 hours (because of the sedation medicines used during the test).    SYMPTOMS TO REPORT IMMEDIATELY: A gastroenterologist can be reached at any hour.  During normal business hours, 8:30 AM to 5:00 PM Monday through Friday, call [phone].  After hours and on weekends, please call the GI answering service at [phone] who will take a message and have the physician on call contact you.   Following lower endoscopy (colonoscopy or flexible sigmoidoscopy):  Excessive amounts of blood in the stool  Significant tenderness or worsening of abdominal pains  Swelling of the abdomen that is new, acute  Fever of 100F or higher  FOLLOW UP: If any biopsies were taken you will be contacted by phone or by letter within the next 1-3 weeks.  Call your gastroenterologist if you have not heard about the biopsies in 3 weeks.  Our staff will call the home number listed on your records the next business day following your procedure to check on you and address any questions or concerns that you may have at that time regarding the information given to you following your procedure. This is a courtesy call and so if there is no answer at the home number and we have not heard from you through the emergency physician on call, we will  assume that you have returned to your regular daily activities without incident.  SIGNATURES/CONFIDENTIALITY: You and/or your care partner have signed paperwork which will be entered into your electronic medical record.  These signatures attest to the fact that that the information above on your After Visit Summary has been reviewed and is understood.  Full responsibility of the confidentiality of this discharge information lies with you and/or your care-partner.  Recommendations Hold aspirin, aspirin products, and anti-inflammatory medication for 2 weeks Timing of  repeat colonoscopy will be determined by pathology findings

## 2013-12-23 NOTE — Progress Notes (Signed)
Procedure ends, to recovery, report given and VSS. 

## 2013-12-26 ENCOUNTER — Telehealth: Payer: Self-pay | Admitting: *Deleted

## 2013-12-26 NOTE — Telephone Encounter (Signed)
  Follow up Call-  Call back number 12/23/2013  Post procedure Call Back phone  # [phone]  Permission to leave phone message No    Mailbox full, no answer, no message left

## 2013-12-27 ENCOUNTER — Encounter: Payer: Self-pay | Admitting: Internal Medicine

## 2013-12-27 NOTE — Progress Notes (Signed)
Quick Note:  7 tubular adenomas - max 15 mm Repeat colonoscopy 2018 ______

## 2013-12-28 ENCOUNTER — Encounter: Payer: Self-pay | Admitting: *Deleted

## 2014-01-13 ENCOUNTER — Encounter: Payer: Self-pay | Admitting: Family Medicine

## 2014-01-13 ENCOUNTER — Ambulatory Visit (INDEPENDENT_AMBULATORY_CARE_PROVIDER_SITE_OTHER): Payer: 59 | Admitting: Family Medicine

## 2014-01-13 VITALS — BP 120/74 | HR 54 | Temp 97.7°F | Wt 194.0 lb

## 2014-01-13 DIAGNOSIS — R030 Elevated blood-pressure reading, without diagnosis of hypertension: Secondary | ICD-10-CM

## 2014-01-13 DIAGNOSIS — IMO0001 Reserved for inherently not codable concepts without codable children: Secondary | ICD-10-CM

## 2014-01-13 NOTE — Patient Instructions (Signed)
Continue with weight control efforts. Your blood pressure is excellent today.

## 2014-01-13 NOTE — Progress Notes (Signed)
   Subjective:    Patient ID: Mark Aguilar, male    DOB: 10-29-1951, 63 y.o.   MRN: [id number]  HPI Followup elevated blood pressure. Patient had reading recently at physical of 150/94. Since that time, he has lost about 15 pounds and feels great. He is walking frequently. He has greatly reduced sugars and starches. He denies any headaches. No dizziness.  Past Medical History  Diagnosis Date  . Arthritis   . Cancer     Skin  . Personal history of colonic polyps - adenomas 12/23/2013    12/23/2013 7 polyps removed     Past Surgical History  Procedure Laterality Date  . Cosmetic surgery      Skin  . Fracture surgery      nose    reports that he has quit smoking. His smoking use included Cigars. He has never used smokeless tobacco. He reports that he drinks alcohol. He reports that he does not use illicit drugs. family history includes Arthritis in his mother; Hypertension in his mother. There is no history of Colon cancer. Allergies  Allergen Reactions  . Bee Venom       Review of Systems  Constitutional: Negative for fatigue.  Eyes: Negative for visual disturbance.  Respiratory: Negative for cough, chest tightness and shortness of breath.   Cardiovascular: Negative for chest pain, palpitations and leg swelling.  Neurological: Negative for dizziness, syncope, weakness, light-headedness and headaches.       Objective:   Physical Exam  Constitutional: He is oriented to person, place, and time. He appears well-developed and well-nourished.  HENT:  Right Ear: External ear normal.  Left Ear: External ear normal.  Mouth/Throat: Oropharynx is clear and moist.  Eyes: Pupils are equal, round, and reactive to light.  Neck: Neck supple. No thyromegaly present.  Cardiovascular: Normal rate and regular rhythm.   Pulmonary/Chest: Effort normal and breath sounds normal. No respiratory distress. He has no wheezes. He has no rales.  Musculoskeletal: He exhibits no edema.  Neurological:  He is alert and oriented to person, place, and time.          Assessment & Plan:  Previous elevated blood pressure which has resolved with his weight loss efforts. Continue weight control efforts. Continue regular aerobic exercise. Followup in one year for physical

## 2014-01-13 NOTE — Progress Notes (Signed)
Pre visit review using our clinic review tool, if applicable. No additional management support is needed unless otherwise documented below in the visit note. 

## 2014-02-06 ENCOUNTER — Telehealth: Payer: Self-pay | Admitting: Family Medicine

## 2014-02-06 NOTE — Telephone Encounter (Signed)
Pt informed that Dr. Elease Hashimoto is out of the office today and tomorrow. Pt is okay with seeing another provider. Pt set up appt to see Padonda 02/08/14

## 2014-02-06 NOTE — Telephone Encounter (Signed)
Patient Information:  Caller Name: Keyonte  Phone: [phone]  Patient: Mark Aguilar, Mark Aguilar  Gender: Male  DOB: 06-01-1951  Age: 63 Years  PCP: Carolann Littler [facility])  Office Follow Up:  Does the office need to follow up with this patient?: Yes  Instructions For The Office: Please schedule schedule appt to be seen . Home care advice and call back parameters reviewed.  RN Note:  Please schedule schedule appt to be seen . Home care advice and call back parameters reviewed.  Symptoms  Reason For Call & Symptoms: Patient states Found a tick on him Saturday 02/04/14  in Left groin. He removed the tick by pulling it out with his fingers and flushed down the toliet.  He washed area with Hydrogen peroxide and alcohol. Today,  He states the area around the bite is swollen. Size of quarter.  There is no drainage. No warmth/heat and pink in color and the pinkness extends down 4 inches.  Hard around and under the bite.  Reviewed Health History In EMR: Yes  Reviewed Medications In EMR: Yes  Reviewed Allergies In EMR: Yes  Reviewed Surgeries / Procedures: Yes  Date of Onset of Symptoms: 02/04/2014  Guideline(s) Used:  Tick Bite  Disposition Per Guideline:   See Today in Office  Reason For Disposition Reached:   Probable deer tick that was attached > 24 hours (or tick appears swollen, not flat)  Advice Given:  Antibiotic Ointment:  Wash the wound and your hands with soap and water after removal to prevent catching any tick disease. Apply an over-the-counter antibiotic ointment (e.g., bacitracin) to the bite once.  Call Back If:  Fever or rash occur in the next 2 weeks  You become worse.  RN Overrode Recommendation:  Make Appointment  Please schedule schedule appt to be seen . Home care advice and call back parameters reviewed.

## 2014-02-07 ENCOUNTER — Ambulatory Visit (INDEPENDENT_AMBULATORY_CARE_PROVIDER_SITE_OTHER): Payer: 59 | Admitting: Family

## 2014-02-07 ENCOUNTER — Encounter: Payer: Self-pay | Admitting: Family

## 2014-02-07 VITALS — BP 130/90 | HR 62 | Temp 98.6°F | Wt 189.0 lb

## 2014-02-07 DIAGNOSIS — W57XXXA Bitten or stung by nonvenomous insect and other nonvenomous arthropods, initial encounter: Secondary | ICD-10-CM

## 2014-02-07 DIAGNOSIS — S30861A Insect bite (nonvenomous) of abdominal wall, initial encounter: Secondary | ICD-10-CM

## 2014-02-07 DIAGNOSIS — S30860A Insect bite (nonvenomous) of lower back and pelvis, initial encounter: Secondary | ICD-10-CM

## 2014-02-07 MED ORDER — DOXYCYCLINE HYCLATE 100 MG PO TABS: 100.0000 mg | ORAL_TABLET | Freq: Two times a day (BID) | ORAL | Status: DC

## 2014-02-07 NOTE — Patient Instructions (Signed)
Tick Bite Information Ticks are insects that attach themselves to the skin and draw blood for food. There are various types of ticks. Common types include wood ticks and deer ticks. Most ticks live in shrubs and grassy areas. Ticks can climb onto your body when you make contact with leaves or grass where the tick is waiting. The most common places on the body for ticks to attach themselves are the scalp, neck, armpits, waist, and groin. Most tick bites are harmless, but sometimes ticks carry germs that cause diseases. These germs can be spread to a person during the tick's feeding process. The chance of a disease spreading through a tick bite depends on:   The type of tick.  Time of year.   How long the tick is attached.   Geographic location.  HOW CAN YOU PREVENT TICK BITES? Take these steps to help prevent tick bites when you are outdoors:  Wear protective clothing. Long sleeves and long pants are best.   Wear white clothes so you can see ticks more easily.  Tuck your pant legs into your socks.   If walking on a trail, stay in the middle of the trail to avoid brushing against bushes.  Avoid walking through areas with long grass.  Put insect repellent on all exposed skin and along boot tops, pant legs, and sleeve cuffs.   Check clothing, hair, and skin repeatedly and before going inside.   Brush off any ticks that are not attached.  Take a shower or bath as soon as possible after being outdoors.  WHAT IS THE PROPER WAY TO REMOVE A TICK? Ticks should be removed as soon as possible to help prevent diseases caused by tick bites. 1. If latex gloves are available, put them on before trying to remove a tick.  2. Using fine-point tweezers, grasp the tick as close to the skin as possible. You may also use curved forceps or a tick removal tool. Grasp the tick as close to its head as possible. Avoid grasping the tick on its body. 3. Pull gently with steady upward pressure until  the tick lets go. Do not twist the tick or jerk it suddenly. This may break off the tick's head or mouth parts. 4. Do not squeeze or crush the tick's body. This could force disease-carrying fluids from the tick into your body.  5. After the tick is removed, wash the bite area and your hands with soap and water or other disinfectant such as alcohol. 6. Apply a small amount of antiseptic cream or ointment to the bite site.  7. Wash and disinfect any instruments that were used.  Do not try to remove a tick by applying a hot match, petroleum jelly, or fingernail polish to the tick. These methods do not work and may increase the chances of disease being spread from the tick bite.  WHEN SHOULD YOU SEEK MEDICAL CARE? Contact your health care provider if you are unable to remove a tick from your skin or if a part of the tick breaks off and is stuck in the skin.  After a tick bite, you need to be aware of signs and symptoms that could be related to diseases spread by ticks. Contact your health care provider if you develop any of the following in the days or weeks after the tick bite:  Unexplained fever.  Rash. A circular rash that appears days or weeks after the tick bite may indicate the possibility of Lyme disease. The rash may resemble   a target with a bull's-eye and may occur at a different part of your body than the tick bite.  Redness and swelling in the area of the tick bite.   Tender, swollen lymph glands.   Diarrhea.   Weight loss.   Cough.   Fatigue.   Muscle, joint, or bone pain.   Abdominal pain.   Headache.   Lethargy or a change in your level of consciousness.  Difficulty walking or moving your legs.   Numbness in the legs.   Paralysis.  Shortness of breath.   Confusion.   Repeated vomiting.  Document Released: 10/17/2000 Document Revised: 08/10/2013 Document Reviewed: 03/30/2013 [employer] Patient Information 2014 [employer].  

## 2014-02-07 NOTE — Progress Notes (Signed)
Pre visit review using our clinic review tool, if applicable. No additional management support is needed unless otherwise documented below in the visit note. 

## 2014-02-07 NOTE — Progress Notes (Signed)
Subjective:    Patient ID: Mark Aguilar, male    DOB: Jun 05, 1951, 63 y.o.   MRN: [id number]  HPI  63 year old white male, nonsmoker, is in today with complaints of a tick bite that occurred 4 days ago. The tick was in his left groin approximately 12 hours after working in the yard. Typically has myalgias but has not noticed an increase in myalgias. No fever. Has noticed increased redness to the site and is now spreading to the left upper thigh. Has not taken any medication for relief.  Review of Systems  Constitutional: Negative.  Negative for fever and diaphoresis.  Respiratory: Negative.   Cardiovascular: Negative.   Gastrointestinal: Negative.   Musculoskeletal: Positive for myalgias. Negative for arthralgias, neck pain and neck stiffness.  Skin: Positive for rash and wound.       Tick bite to the left groin with red rash  Neurological: Negative.   Psychiatric/Behavioral: Negative.    Past Medical History  Diagnosis Date  . Arthritis   . Cancer     Skin  . Personal history of colonic polyps - adenomas 12/23/2013    12/23/2013 7 polyps removed      History   Social History  . Marital Status: Married    Spouse Name: N/A    Number of Children: N/A  . Years of Education: N/A   Occupational History  . Not on file.   Social History Main Topics  . Smoking status: Former Smoker    Types: Cigars  . Smokeless tobacco: Never Used  . Alcohol Use: 0.0 oz/week    4-6 Glasses of wine per week  . Drug Use: No  . Sexual Activity: Not on file   Other Topics Concern  . Not on file   Social History Narrative  . No narrative on file    Past Surgical History  Procedure Laterality Date  . Cosmetic surgery      Skin  . Fracture surgery      nose    Family History  Problem Relation Age of Onset  . Arthritis Mother   . Hypertension Mother   . Colon cancer Neg Hx     Allergies  Allergen Reactions  . Bee Venom     Current Outpatient Prescriptions on File Prior to  Visit  Medication Sig Dispense Refill  . EPINEPHrine (EPIPEN 2-PAK) 0.3 mg/0.3 mL SOAJ injection Inject 0.3 mLs (0.3 mg total) into the muscle once.  1 Device  0  . Multiple Vitamin (MULTIVITAMIN) tablet Take 1 tablet by mouth daily.      . Omega-3 Fatty Acids (FISH OIL PO) Take 4 capsules by mouth daily.      Marland Kitchen terbinafine (LAMISIL) 250 MG tablet Take 1 tablet (250 mg total) by mouth daily.  90 tablet  0   No current facility-administered medications on file prior to visit.    BP 130/90  Pulse 62  Temp(Src) 98.6 F (37 C) (Oral)  Wt 189 lb (85.73 kg)chart    Objective:   Physical Exam  Constitutional: He is oriented to person, place, and time. He appears well-developed and well-nourished.  HENT:  Right Ear: External ear normal.  Left Ear: External ear normal.  Nose: Nose normal.  Mouth/Throat: Oropharynx is clear and moist.  Neck: Normal range of motion. Neck supple.  Cardiovascular: Normal rate, regular rhythm and normal heart sounds.   Pulmonary/Chest: Effort normal and breath sounds normal.  Abdominal: Soft. Bowel sounds are normal.  Musculoskeletal: Normal range of motion.  Neurological: He is alert and oriented to person, place, and time.  Skin: Rash noted. There is erythema.     Psychiatric: He has a normal mood and affect.          Assessment & Plan:  Kevontae was seen today for tick bite.  Diagnoses and associated orders for this visit:  Tick bite of groin  Other Orders - doxycycline (VIBRA-TABS) 100 MG tablet; Take 1 tablet (100 mg total) by mouth 2 (two) times daily.   Call the office if any questions or concerns. Recheck as scheduled, and as needed.

## 2015-02-26 ENCOUNTER — Encounter: Payer: Self-pay | Admitting: Family Medicine

## 2015-02-26 ENCOUNTER — Ambulatory Visit (INDEPENDENT_AMBULATORY_CARE_PROVIDER_SITE_OTHER): Payer: 59 | Admitting: Family Medicine

## 2015-02-26 VITALS — BP 130/80 | HR 66 | Temp 98.3°F | Wt 184.0 lb

## 2015-02-26 DIAGNOSIS — W57XXXA Bitten or stung by nonvenomous insect and other nonvenomous arthropods, initial encounter: Secondary | ICD-10-CM

## 2015-02-26 DIAGNOSIS — T148 Other injury of unspecified body region: Secondary | ICD-10-CM | POA: Diagnosis not present

## 2015-02-26 NOTE — Patient Instructions (Signed)
Tick Bite Information Ticks are insects that attach themselves to the skin and draw blood for food. There are various types of ticks. Common types include wood ticks and deer ticks. Most ticks live in shrubs and grassy areas. Ticks can climb onto your body when you make contact with leaves or grass where the tick is waiting. The most common places on the body for ticks to attach themselves are the scalp, neck, armpits, waist, and groin. Most tick bites are harmless, but sometimes ticks carry germs that cause diseases. These germs can be spread to a person during the tick's feeding process. The chance of a disease spreading through a tick bite depends on:   The type of tick.  Time of year.   How long the tick is attached.   Geographic location.  HOW CAN YOU PREVENT TICK BITES? Take these steps to help prevent tick bites when you are outdoors:  Wear protective clothing. Long sleeves and long pants are best.   Wear white clothes so you can see ticks more easily.  Tuck your pant legs into your socks.   If walking on a trail, stay in the middle of the trail to avoid brushing against bushes.  Avoid walking through areas with long grass.  Put insect repellent on all exposed skin and along boot tops, pant legs, and sleeve cuffs.   Check clothing, hair, and skin repeatedly and before going inside.   Brush off any ticks that are not attached.  Take a shower or bath as soon as possible after being outdoors.  WHAT IS THE PROPER WAY TO REMOVE A TICK? Ticks should be removed as soon as possible to help prevent diseases caused by tick bites. 1. If latex gloves are available, put them on before trying to remove a tick.  2. Using fine-point tweezers, grasp the tick as close to the skin as possible. You may also use curved forceps or a tick removal tool. Grasp the tick as close to its head as possible. Avoid grasping the tick on its body. 3. Pull gently with steady upward pressure until  the tick lets go. Do not twist the tick or jerk it suddenly. This may break off the tick's head or mouth parts. 4. Do not squeeze or crush the tick's body. This could force disease-carrying fluids from the tick into your body.  5. After the tick is removed, wash the bite area and your hands with soap and water or other disinfectant such as alcohol. 6. Apply a small amount of antiseptic cream or ointment to the bite site.  7. Wash and disinfect any instruments that were used.  Do not try to remove a tick by applying a hot match, petroleum jelly, or fingernail polish to the tick. These methods do not work and may increase the chances of disease being spread from the tick bite.  WHEN SHOULD YOU SEEK MEDICAL CARE? Contact your health care provider if you are unable to remove a tick from your skin or if a part of the tick breaks off and is stuck in the skin.  After a tick bite, you need to be aware of signs and symptoms that could be related to diseases spread by ticks. Contact your health care provider if you develop any of the following in the days or weeks after the tick bite:  Unexplained fever.  Rash. A circular rash that appears days or weeks after the tick bite may indicate the possibility of Lyme disease. The rash may resemble   a target with a bull's-eye and may occur at a different part of your body than the tick bite.  Redness and swelling in the area of the tick bite.   Tender, swollen lymph glands.   Diarrhea.   Weight loss.   Cough.   Fatigue.   Muscle, joint, or bone pain.   Abdominal pain.   Headache.   Lethargy or a change in your level of consciousness.  Difficulty walking or moving your legs.   Numbness in the legs.   Paralysis.  Shortness of breath.   Confusion.   Repeated vomiting.  Document Released: 10/17/2000 Document Revised: 08/10/2013 Document Reviewed: 03/30/2013 Exit[employer] Patient Information 2015 [employer]. This information is  not intended to replace advice given to you by your health care provider. Make sure you discuss any questions you have with your health care provider.  

## 2015-02-26 NOTE — Progress Notes (Signed)
Pre visit review using our clinic review tool, if applicable. No additional management support is needed unless otherwise documented below in the visit note. 

## 2015-02-26 NOTE — Progress Notes (Signed)
   Subjective:    Patient ID: Mark Aguilar, male    DOB: 1951-08-12, 64 y.o.   MRN: [id number]  HPI Patient had recent tick bites with 3 deer ticks lower extremities. He has increased redness particularly on his right medial thigh but no pain. Rash is pruritic. No fever. No headaches. No arthralgias. No recent travels to high endemic area for Lyme disease.  Past Medical History  Diagnosis Date  . Arthritis   . Cancer     Skin  . Personal history of colonic polyps - adenomas 12/23/2013    12/23/2013 7 polyps removed     Past Surgical History  Procedure Laterality Date  . Cosmetic surgery      Skin  . Fracture surgery      nose    reports that he has quit smoking. His smoking use included Cigars. He has never used smokeless tobacco. He reports that he drinks alcohol. He reports that he does not use illicit drugs. family history includes Arthritis in his mother; Hypertension in his mother. There is no history of Colon cancer. Allergies  Allergen Reactions  . Bee Venom      Review of Systems  Constitutional: Negative for fever and chills.  Skin: Positive for rash.  Neurological: Negative for headaches.       Objective:   Physical Exam  Constitutional: He appears well-developed and well-nourished.  Cardiovascular: Normal rate and regular rhythm.   Pulmonary/Chest: Effort normal and breath sounds normal. No respiratory distress. He has no wheezes.  Skin:  Right medial thigh 10 x 10 cm area of erythema. No warmth. Nontender. He has a couple of much smaller areas of erythema including left lower leg and right lower leg which are approximately 1/2 cm in diameter. No visible retained tick parts.          Assessment & Plan:  Tick bites with local allergic reaction. These appeared to be  deer ticks (per patient). He does not have any concerning symptoms such as fever, erythema migrans, headache, or recent travel to high endemic area. Observation. Antihistamine for local allergic  reaction. Follow-up as needed

## 2016-06-09 ENCOUNTER — Encounter: Payer: Self-pay | Admitting: Family Medicine

## 2016-06-09 ENCOUNTER — Ambulatory Visit (INDEPENDENT_AMBULATORY_CARE_PROVIDER_SITE_OTHER): Payer: 59 | Admitting: Family Medicine

## 2016-06-09 VITALS — BP 120/80 | HR 74 | Temp 98.0°F | Ht 72.0 in | Wt 174.9 lb

## 2016-06-09 DIAGNOSIS — R197 Diarrhea, unspecified: Secondary | ICD-10-CM

## 2016-06-09 NOTE — Progress Notes (Signed)
Pre visit review using our clinic review tool, if applicable. No additional management support is needed unless otherwise documented below in the visit note. 

## 2016-06-09 NOTE — Progress Notes (Signed)
  HPI:  Acute visit for:  Diarrhea: -started about 1 week ago -initially 3-4 loose to watery BM daily, nausea and low grade sub fever for a few days -improved but still with about 2 loose BMs daily -denies: focal or persistent abd pain, vomiting, mucus or blood in stools, fevers, malaise, recent travel or antibiotics  ROS: See pertinent positives and negatives per HPI.  Past Medical History:  Diagnosis Date  . Arthritis   . Cancer (HCC)    Skin  . Personal history of colonic polyps - adenomas 12/23/2013   12/23/2013 7 polyps removed      Past Surgical History:  Procedure Laterality Date  . COSMETIC SURGERY     Skin  . FRACTURE SURGERY     nose    Family History  Problem Relation Age of Onset  . Arthritis Mother   . Hypertension Mother   . Colon cancer Neg Hx     Social History   Social History  . Marital status: Married    Spouse name: N/A  . Number of children: N/A  . Years of education: N/A   Social History Main Topics  . Smoking status: Former Smoker    Types: Cigars  . Smokeless tobacco: Never Used  . Alcohol use 0.0 oz/week    4 - 6 Glasses of wine per week  . Drug use: No  . Sexual activity: Not Asked   Other Topics Concern  . None   Social History Narrative  . None     Current Outpatient Prescriptions:  .  EPINEPHrine (EPIPEN 2-PAK) 0.3 mg/0.3 mL SOAJ injection, Inject 0.3 mLs (0.3 mg total) into the muscle once., Disp: 1 Device, Rfl: 0 .  Multiple Vitamin (MULTIVITAMIN) tablet, Take 1 tablet by mouth daily., Disp: , Rfl:  .  Omega-3 Fatty Acids (FISH OIL PO), Take 4 capsules by mouth daily., Disp: , Rfl:   EXAM:  Vitals:   06/09/16 1628  BP: 120/80  Pulse: 74  Temp: 98 F (36.7 C)    Body mass index is 23.72 kg/m.  GENERAL: vitals reviewed and listed above, alert, oriented, appears well hydrated and in no acute distress  HEENT: atraumatic, conjunttiva clear, no obvious abnormalities on inspection of external nose and ears  NECK:  no obvious masses on inspection  LUNGS: clear to auscultation bilaterally, no wheezes, rales or rhonchi, good air movement  CV: HRRR, no peripheral edema  ABD: BS+, soft, NTTP, no rebound or guarding  MS: moves all extremities without noticeable abnormality  PSYCH: pleasant and cooperative, no obvious depression or anxiety  ASSESSMENT AND PLAN:  Discussed the following assessment and plan:  Diarrhea, unspecified type  -suspect gastroenteritis, benign exam and seems to be improving -opted for imodium, no dairy, plenty of fluids and follow up if new symptoms, worsens again or does not resolve over the next week   Patient Instructions  Imodium as needed for diarrhea.  No dairy for 1-2 weeks.  Plenty of fluids and regular meals.  Follow up with worsening, new symptoms or symptoms persist in 1 week.   Colin Benton R., DO

## 2016-06-09 NOTE — Patient Instructions (Signed)
Imodium as needed for diarrhea.  No dairy for 1-2 weeks.  Plenty of fluids and regular meals.  Follow up with worsening, new symptoms or symptoms persist in 1 week.

## 2016-12-15 ENCOUNTER — Encounter: Payer: Self-pay | Admitting: Internal Medicine

## 2018-09-08 DIAGNOSIS — D2262 Melanocytic nevi of left upper limb, including shoulder: Secondary | ICD-10-CM | POA: Diagnosis not present

## 2018-09-08 DIAGNOSIS — L814 Other melanin hyperpigmentation: Secondary | ICD-10-CM | POA: Diagnosis not present

## 2018-09-08 DIAGNOSIS — D225 Melanocytic nevi of trunk: Secondary | ICD-10-CM | POA: Diagnosis not present

## 2018-09-08 DIAGNOSIS — L570 Actinic keratosis: Secondary | ICD-10-CM | POA: Diagnosis not present

## 2018-09-08 DIAGNOSIS — L821 Other seborrheic keratosis: Secondary | ICD-10-CM | POA: Diagnosis not present

## 2018-12-27 DIAGNOSIS — H18832 Recurrent erosion of cornea, left eye: Secondary | ICD-10-CM | POA: Diagnosis not present

## 2019-07-27 ENCOUNTER — Telehealth: Payer: Self-pay

## 2019-07-27 NOTE — Telephone Encounter (Signed)
Called patient and he stated that he has swelling under his right eye and has been going on for a few days. Has had a fever since Monday. Patient states that he does not feel good and he is going to [facility] to do a rapid Covid test. Patient verbalized an understanding.  Copied from [place] [id number]. Topic: General - Other >> Jul 27, 2019 11:25 AM Mathis Bud wrote: Reason for CRM: Patient has a lowgrade fever and it going to go get tested for covid today.  Patient wanted PCP to know Call back [phone]

## 2019-07-28 ENCOUNTER — Other Ambulatory Visit: Payer: Self-pay

## 2019-07-28 DIAGNOSIS — Z20822 Contact with and (suspected) exposure to covid-19: Secondary | ICD-10-CM

## 2019-07-29 ENCOUNTER — Encounter: Payer: Self-pay | Admitting: Family Medicine

## 2019-07-29 ENCOUNTER — Other Ambulatory Visit: Payer: Self-pay

## 2019-07-29 ENCOUNTER — Telehealth (INDEPENDENT_AMBULATORY_CARE_PROVIDER_SITE_OTHER): Payer: Medicare Other | Admitting: Family Medicine

## 2019-07-29 DIAGNOSIS — R5383 Other fatigue: Secondary | ICD-10-CM

## 2019-07-29 DIAGNOSIS — R509 Fever, unspecified: Secondary | ICD-10-CM

## 2019-07-29 LAB — NOVEL CORONAVIRUS, NAA: SARS-CoV-2, NAA: NOT DETECTED

## 2019-07-29 NOTE — Progress Notes (Signed)
This visit type was conducted due to national recommendations for restrictions regarding the COVID-19 pandemic in an effort to limit this patient's exposure and mitigate transmission in our community.   Virtual Visit via Video Note  I connected with Mark Aguilar on 07/29/19 at 11:15 AM EDT by a video enabled telemedicine application and verified that I am speaking with the correct person using two identifiers.  Location patient: home Location provider:work or home office Persons participating in the virtual visit: patient, provider  I discussed the limitations of evaluation and management by telemedicine and the availability of in person appointments. The patient expressed understanding and agreed to proceed.   HPI: Patient relates onset Monday of possible fever.  Temperature was not taken then.  He had some intermittent sensation of possible fever since that time and also intermittent sweats.  He did take his temperature earlier today which was not elevated.  He denies any cough.  No dyspnea.  No body aches.  Denies any rash, headaches, diarrhea, or any loss of taste or smell.  He actually went for COVID testing yesterday and that is pending.  No dysuria.  No reported recent tick bites.  No known sick exposures though he had been out couple times recently around large groups  Only other symptoms he has had some clear watering from the right eye but no blurred vision.  He has had a bit of puffiness below the eye which seems to respond to cool compresses.  He has scheduled follow-up with his optometrist next week   ROS: See pertinent positives and negatives per HPI.  Past Medical History:  Diagnosis Date  . Arthritis   . Cancer (HCC)    Skin  . Personal history of colonic polyps - adenomas 12/23/2013   12/23/2013 7 polyps removed      Past Surgical History:  Procedure Laterality Date  . COSMETIC SURGERY     Skin  . FRACTURE SURGERY     nose    Family History  Problem Relation Age of  Onset  . Arthritis Mother   . Hypertension Mother   . Colon cancer Neg Hx     SOCIAL HX: Non-smoker   Current Outpatient Medications:  .  EPINEPHrine (EPIPEN 2-PAK) 0.3 mg/0.3 mL SOAJ injection, Inject 0.3 mLs (0.3 mg total) into the muscle once., Disp: 1 Device, Rfl: 0 .  Multiple Vitamin (MULTIVITAMIN) tablet, Take 1 tablet by mouth daily., Disp: , Rfl:  .  Omega-3 Fatty Acids (FISH OIL PO), Take 4 capsules by mouth daily., Disp: , Rfl:   EXAM:  VITALS per patient if applicable:  GENERAL: alert, oriented, appears well and in no acute distress  HEENT: atraumatic, no obvious abnormalities on inspection of external nose and ears  NECK: normal movements of the head and neck  LUNGS: on inspection no signs of respiratory distress, breathing rate appears normal, no obvious gross SOB, gasping or wheezing  CV: no obvious cyanosis  MS: moves all visible extremities without noticeable abnormality  PSYCH/NEURO: pleasant and cooperative, no obvious depression or anxiety, speech and thought processing grossly intact  ASSESSMENT AND PLAN:  Discussed the following assessment and plan:  Patient relates 4-day history of nonspecific symptoms of some fatigue and subjective fever though not documented.  COVID test is still pending.  He does not have any worrisome symptoms such as dyspnea.  He is describing some nonspecific early morning puffiness under the right eye but no typical conjunctivitis type symptoms  -Follow-up COVID test which will hopefully back  later today -Continue to monitor closely for fever -Symptomatic management with plenty of fluids, Tylenol or Advil for fever and follow-up promptly for any shortness of breath or other concerns -If COVID test negative and he has persistent symptoms in the next week consider office follow-up to further assess -Suggest consider over-the-counter antihistamine such as Claritin or Zyrtec for eye symptoms and do recommend eye exam if symptoms  persist    I discussed the assessment and treatment plan with the patient. The patient was provided an opportunity to ask questions and all were answered. The patient agreed with the plan and demonstrated an understanding of the instructions.   The patient was advised to call back or seek an in-person evaluation if the symptoms worsen or if the condition fails to improve as anticipated.   Carolann Littler, MD

## 2019-08-03 DIAGNOSIS — H2513 Age-related nuclear cataract, bilateral: Secondary | ICD-10-CM | POA: Diagnosis not present

## 2019-09-22 DIAGNOSIS — L821 Other seborrheic keratosis: Secondary | ICD-10-CM | POA: Diagnosis not present

## 2019-09-22 DIAGNOSIS — D2261 Melanocytic nevi of right upper limb, including shoulder: Secondary | ICD-10-CM | POA: Diagnosis not present

## 2019-09-22 DIAGNOSIS — D225 Melanocytic nevi of trunk: Secondary | ICD-10-CM | POA: Diagnosis not present

## 2019-09-22 DIAGNOSIS — D2262 Melanocytic nevi of left upper limb, including shoulder: Secondary | ICD-10-CM | POA: Diagnosis not present

## 2019-10-20 ENCOUNTER — Ambulatory Visit (INDEPENDENT_AMBULATORY_CARE_PROVIDER_SITE_OTHER): Payer: Medicare Other

## 2019-10-20 DIAGNOSIS — Z0000 Encounter for general adult medical examination without abnormal findings: Secondary | ICD-10-CM | POA: Diagnosis not present

## 2019-10-20 DIAGNOSIS — Z1211 Encounter for screening for malignant neoplasm of colon: Secondary | ICD-10-CM | POA: Diagnosis not present

## 2019-10-20 NOTE — Patient Instructions (Addendum)
Mark Aguilar , Thank you for taking time to participate in your Medicare Wellness Visit. I appreciate your ongoing commitment to your health goals. Please review the following plan we discussed and let me know if I can assist you in the future.   Screening recommendations/referrals: Colorectal Screening: 12/23/2013; past due 12/24/2018; order sent to the [facility] and they will contact you to schedule. Vision and Dental Exams: Recommended annual ophthalmology exams for early detection of glaucoma and other disorders of the eye Recommended annual dental exams for proper oral hygiene  Diabetic Exams: Diabetic Eye Exam: N/A Diabetic Foot Exam: N/A  Vaccinations: Influenza vaccine: declines Pneumococcal vaccine: patient needs and will consider having at next in office appointment. He also understands he can have this done at a local pharmacy. Tdap vaccine:completed 10/13/2013; due again 10/14/2023. Shingles vaccine: Please check with your pharmacy for your out of pocket cost. This is a series of two injections. You may have this done at a pharmacy or at our office.  Advanced directives: Advance directives discussed with you today. I have provided a copy for you to complete at home and have notarized. Once this is complete please bring a copy in to our office so we can scan it into your chart.  Goals: Continue to  drink at least 6-8 8oz glasses of water per day. Continue to exercise for at least 150 minutes per week. You are doing a great job!  Next appointment: Please schedule your Annual Wellness Visit with your Nurse Health Advisor in one year.  Preventive Care 38 Years and Older, Male Preventive care refers to lifestyle choices and visits with your health care provider that can promote health and wellness. What does preventive care include?  A yearly physical exam. This is also called an annual well check.  Dental exams once or twice a year.  Routine eye exams. Ask your health  care provider how often you should have your eyes checked.  Personal lifestyle choices, including:  Daily care of your teeth and gums.  Regular physical activity.  Eating a healthy diet.  Avoiding tobacco and drug use.  Limiting alcohol use.  Practicing safe sex.  Taking low doses of aspirin every day if recommended by your health care provider..  Taking vitamin and mineral supplements as recommended by your health care provider. What happens during an annual well check? The services and screenings done by your health care provider during your annual well check will depend on your age, overall health, lifestyle risk factors, and family history of disease. Counseling  Your health care provider may ask you questions about your:  Alcohol use.  Tobacco use.  Drug use.  Emotional well-being.  Home and relationship well-being.  Sexual activity.  Eating habits.  History of falls.  Memory and ability to understand (cognition).  Work and work Statistician. Screening  You may have the following tests or measurements:  Height, weight, and BMI.  Blood pressure.  Lipid and cholesterol levels. These may be checked every 5 years, or more frequently if you are over 52 years old.  Skin check.  Lung cancer screening. You may have this screening every year starting at age 66 if you have a 30-pack-year history of smoking and currently smoke or have quit within the past 15 years.  Fecal occult blood test (FOBT) of the stool. You may have this test every year starting at age 63.  Flexible sigmoidoscopy or colonoscopy. You may have a sigmoidoscopy every 5 years or a colonoscopy every  10 years starting at age 24.  Prostate cancer screening. Recommendations will vary depending on your family history and other risks.  Hepatitis C blood test.  Hepatitis B blood test.  Sexually transmitted disease (STD) testing.  Diabetes screening. This is done by checking your blood sugar  (glucose) after you have not eaten for a while (fasting). You may have this done every 1-3 years.  Abdominal aortic aneurysm (AAA) screening. You may need this if you are a current or former smoker.  Osteoporosis. You may be screened starting at age 23 if you are at high risk. Talk with your health care provider about your test results, treatment options, and if necessary, the need for more tests. Vaccines  Your health care provider may recommend certain vaccines, such as:  Influenza vaccine. This is recommended every year.  Tetanus, diphtheria, and acellular pertussis (Tdap, Td) vaccine. You may need a Td booster every 10 years.  Zoster vaccine. You may need this after age 61.  Pneumococcal 13-valent conjugate (PCV13) vaccine. One dose is recommended after age 52.  Pneumococcal polysaccharide (PPSV23) vaccine. One dose is recommended after age 49. Talk to your health care provider about which screenings and vaccines you need and how often you need them. This information is not intended to replace advice given to you by your health care provider. Make sure you discuss any questions you have with your health care provider. Document Released: 11/16/2015 Document Revised: 07/09/2016 Document Reviewed: 08/21/2015 Elsevier Interactive Patient Education  2017 [employer] Prevention in the Home Falls can cause injuries. They can happen to people of all ages. There are many things you can do to make your home safe and to help prevent falls. What can I do on the outside of my home?  Regularly fix the edges of walkways and driveways and fix any cracks.  Remove anything that might make you trip as you walk through a door, such as a raised step or threshold.  Trim any bushes or trees on the path to your home.  Use bright outdoor lighting.  Clear any walking paths of anything that might make someone trip, such as rocks or tools.  Regularly check to see if handrails are loose or  broken. Make sure that both sides of any steps have handrails.  Any raised decks and porches should have guardrails on the edges.  Have any leaves, snow, or ice cleared regularly.  Use sand or salt on walking paths during winter.  Clean up any spills in your garage right away. This includes oil or grease spills. What can I do in the bathroom?  Use night lights.  Install grab bars by the toilet and in the tub and shower. Do not use towel bars as grab bars.  Use non-skid mats or decals in the tub or shower.  If you need to sit down in the shower, use a plastic, non-slip stool.  Keep the floor dry. Clean up any water that spills on the floor as soon as it happens.  Remove soap buildup in the tub or shower regularly.  Attach bath mats securely with double-sided non-slip rug tape.  Do not have throw rugs and other things on the floor that can make you trip. What can I do in the bedroom?  Use night lights.  Make sure that you have a light by your bed that is easy to reach.  Do not use any sheets or blankets that are too big for your bed. They should not  hang down onto the floor.  Have a firm chair that has side arms. You can use this for support while you get dressed.  Do not have throw rugs and other things on the floor that can make you trip. What can I do in the kitchen?  Clean up any spills right away.  Avoid walking on wet floors.  Keep items that you use a lot in easy-to-reach places.  If you need to reach something above you, use a strong step stool that has a grab bar.  Keep electrical cords out of the way.  Do not use floor polish or wax that makes floors slippery. If you must use wax, use non-skid floor wax.  Do not have throw rugs and other things on the floor that can make you trip. What can I do with my stairs?  Do not leave any items on the stairs.  Make sure that there are handrails on both sides of the stairs and use them. Fix handrails that are  broken or loose. Make sure that handrails are as long as the stairways.  Check any carpeting to make sure that it is firmly attached to the stairs. Fix any carpet that is loose or worn.  Avoid having throw rugs at the top or bottom of the stairs. If you do have throw rugs, attach them to the floor with carpet tape.  Make sure that you have a light switch at the top of the stairs and the bottom of the stairs. If you do not have them, ask someone to add them for you. What else can I do to help prevent falls?  Wear shoes that:  Do not have high heels.  Have rubber bottoms.  Are comfortable and fit you well.  Are closed at the toe. Do not wear sandals.  If you use a stepladder:  Make sure that it is fully opened. Do not climb a closed stepladder.  Make sure that both sides of the stepladder are locked into place.  Ask someone to hold it for you, if possible.  Clearly mark and make sure that you can see:  Any grab bars or handrails.  First and last steps.  Where the edge of each step is.  Use tools that help you move around (mobility aids) if they are needed. These include:  Canes.  Walkers.  Scooters.  Crutches.  Turn on the lights when you go into a dark area. Replace any light bulbs as soon as they burn out.  Set up your furniture so you have a clear path. Avoid moving your furniture around.  If any of your floors are uneven, fix them.  If there are any pets around you, be aware of where they are.  Review your medicines with your doctor. Some medicines can make you feel dizzy. This can increase your chance of falling. Ask your doctor what other things that you can do to help prevent falls. This information is not intended to replace advice given to you by your health care provider. Make sure you discuss any questions you have with your health care provider. Document Released: 08/16/2009 Document Revised: 03/27/2016 Document Reviewed: 11/24/2014 Elsevier  Interactive Patient Education  2017 [employer].

## 2019-10-20 NOTE — Progress Notes (Signed)
This visit is being conducted via phone call due to the COVID-19 pandemic. This patient has given me verbal consent via phone to conduct this visit, patient states they are participating from their home address. Some vital signs may be absent or patient reported.   Patient identification: identified by name, DOB, and current address.  Location provider: [facility], Office Persons participating in the virtual visit: Mr. Mark Aguilar and Mark Forts, LPN.   Subjective:   Mark Aguilar is a 68 y.o. male who presents for an Initial Medicare Annual Wellness Visit.   Mr. Imparato is doing very well at this time. He is working from home during the pandemic. He walks 2 miles every day, eats healthy balanced meals, and drinks plenty of water per his report.  Review of Systems   Cardiac Risk Factors include: advanced age (>34men, >31 women)    Objective:    Today's Vitals   10/20/19 1027  Weight: 178 lb (80.7 kg)  Height: 6' (1.829 m)   Body mass index is 24.14 kg/m.  Advanced Directives 10/20/2019  Does Patient Have a Medical Advance Directive? No  Would patient like information on creating a medical advance directive? Yes (MAU/Ambulatory/Procedural Areas - Information given)    Current Medications (verified) Outpatient Encounter Medications as of 10/20/2019  Medication Sig  . EPINEPHrine (EPIPEN 2-PAK) 0.3 mg/0.3 mL SOAJ injection Inject 0.3 mLs (0.3 mg total) into the muscle once.  . Multiple Vitamin (MULTIVITAMIN) tablet Take 1 tablet by mouth daily.  . Omega-3 Fatty Acids (FISH OIL PO) Take 4 capsules by mouth daily.   No facility-administered encounter medications on file as of 10/20/2019.    Allergies (verified) Bee venom   History: Past Medical History:  Diagnosis Date  . Arthritis   . Cancer (HCC)    Skin  . Personal history of colonic polyps - adenomas 12/23/2013   12/23/2013 7 polyps removed     Past Surgical History:  Procedure Laterality Date  . COSMETIC  SURGERY     Skin  . FRACTURE SURGERY     nose   Family History  Problem Relation Age of Onset  . Arthritis Mother   . Hypertension Mother   . Colon cancer Neg Hx    Social History   Socioeconomic History  . Marital status: Married    Spouse name: Not on file  . Number of children: 0  . Years of education: 4 years college  . Highest education level: Bachelor's degree (e.g., BA, AB, BS)  Occupational History  . Not on file  Tobacco Use  . Smoking status: Former Smoker    Types: Cigars  . Smokeless tobacco: Never Used  Substance and Sexual Activity  . Alcohol use: Yes    Alcohol/week: 4.0 - 6.0 standard drinks    Types: 4 - 6 Glasses of wine per week    Comment: per week  . Drug use: No  . Sexual activity: Not on file  Other Topics Concern  . Not on file  Social History Narrative   Still working full time as Environmental education officer   Married   1 dog    Social Determinants of Health   Financial Resource Strain:   . Difficulty of Paying Living Expenses: Not on file  Food Insecurity:   . Worried About Charity fundraiser in the Last Year: Not on file  . Ran Out of Food in the Last Year: Not on file  Transportation Needs:   . Lack of Transportation (Medical):  Not on file  . Lack of Transportation (Non-Medical): Not on file  Physical Activity:   . Days of Exercise per Week: Not on file  . Minutes of Exercise per Session: Not on file  Stress:   . Feeling of Stress : Not on file  Social Connections:   . Frequency of Communication with Friends and Family: Not on file  . Frequency of Social Gatherings with Friends and Family: Not on file  . Attends Religious Services: Not on file  . Active Member of Clubs or Organizations: Not on file  . Attends Archivist Meetings: Not on file  . Marital Status: Not on file   Tobacco Counseling Counseling given: Not Answered   Clinical Intake:  Pre-visit preparation completed: Yes  Pain : No/denies pain      Nutritional Status: BMI of 19-24  Normal Nutritional Risks: None Diabetes: No  How often do you need to have someone help you when you read instructions, pamphlets, or other written materials from your doctor or pharmacy?: 1 - Never What is the last grade level you completed in school?: 4 years college  Interpreter Needed?: No  Information entered by :: Mark Forts, LPN.  Activities of Daily Living In your present state of health, do you have any difficulty performing the following activities: 10/20/2019  Hearing? N  Vision? N  Difficulty concentrating or making decisions? N  Walking or climbing stairs? N  Dressing or bathing? N  Doing errands, shopping? N  Preparing Food and eating ? N  Using the Toilet? N  In the past six months, have you accidently leaked urine? N  Do you have problems with loss of bowel control? N  Managing your Medications? N  Managing your Finances? N  Housekeeping or managing your Housekeeping? N  Some recent data might be hidden     Immunizations and Health Maintenance Immunization History  Administered Date(s) Administered  . Tdap 10/13/2013   Health Maintenance Due  Topic Date Due  . Hepatitis C Screening  09-Nov-1950  . COLONOSCOPY  12/23/2016  . PNA vac Low Risk Adult (1 of 2 - PCV13) 04/01/2018  . INFLUENZA VACCINE  06/04/2019    Patient Care Team: Eulas Post, MD as PCP - General (Family Medicine)  Indicate any recent Medical Services you may have received from other than [facility] providers in the past year (date may be approximate).    Assessment:   This is a routine wellness examination for Mark Aguilar.  Hearing/Vision screen  Hearing Screening   125Hz  250Hz  500Hz  1000Hz  2000Hz  3000Hz  4000Hz  6000Hz  8000Hz   Right ear:           Left ear:           Comments: Patient denies hearing problems  Vision Screening Comments: Wears glasses; recently saw eye provider  Dietary issues and exercise activities discussed: Current  Exercise Habits: Home exercise routine, Type of exercise: walking, Time (Minutes): 30, Frequency (Times/Week): 7, Weekly Exercise (Minutes/Week): 210, Intensity: Moderate, Exercise limited by: None identified  Goals   None    Depression Screen PHQ 2/9 Scores 10/20/2019  PHQ - 2 Score 0    Fall Risk Fall Risk  10/20/2019  Falls in the past year? 0    Is the patient's home free of loose throw rugs in walkways, pet beds, electrical cords, etc?   yes      Grab bars in the bathroom? yes      Handrails on the stairs?   yes  Adequate lighting?   yes  Timed Get Up and Go performed: N/A due to telephone visit.  Cognitive Function:     6CIT Screen 10/20/2019  What Year? 0 points  What month? 0 points  What time? 0 points  Count back from 20 0 points  Months in reverse 0 points  Repeat phrase 0 points  Total Score 0   Screening Tests Health Maintenance  Topic Date Due  . Hepatitis C Screening  [birth year]  . COLONOSCOPY  12/23/2016  . PNA vac Low Risk Adult (1 of 2 - PCV13) 04/01/2018  . INFLUENZA VACCINE  06/04/2019  . TETANUS/TDAP  10/14/2023    Qualifies for Shingles Vaccine? Yes, he will check with pharmacy for his out of pocket cost. He also understands he may have this done at our office.   Cancer Screenings: Lung: Low Dose CT Chest recommended if Age 70-80 years, 30 pack-year currently smoking OR have quit w/in 15years. Patient does not qualify. Colorectal: order sent to [facility] for repeat colonoscopy.  Additional Screenings:  Hepatitis C Screening: He will have this drawn at next office visit with other routine labs.    Plan:   Mr. Briner was encouraged to schedule cpe with Dr. Elease Hashimoto at his convenience as he will need routine labs at this time (to include Hep C screening). He may elect to have PPS-23 and Shingrix then also. Patient declines flu vaccine.  I have personally reviewed and noted the following in the patient's chart:   . Medical  and social history . Use of alcohol, tobacco or illicit drugs  . Current medications and supplements . Functional ability and status . Nutritional status . Physical activity . Advanced directives . List of other physicians . Hospitalizations, surgeries, and ER visits in previous 12 months . Vitals . Screenings to include cognitive, depression, and falls . Referrals and appointments  In addition, I have reviewed and discussed with patient certain preventive protocols, quality metrics, and best practice recommendations. A written personalized care plan for preventive services as well as general preventive health recommendations were provided to patient.     Mark Forts, LPN   [phone]

## 2019-10-31 ENCOUNTER — Encounter: Payer: Self-pay | Admitting: Internal Medicine

## 2019-11-25 ENCOUNTER — Other Ambulatory Visit: Payer: Self-pay

## 2019-11-25 ENCOUNTER — Ambulatory Visit (AMBULATORY_SURGERY_CENTER): Payer: Self-pay | Admitting: *Deleted

## 2019-11-25 VITALS — Temp 96.2°F | Ht 72.0 in | Wt 184.0 lb

## 2019-11-25 DIAGNOSIS — Z01818 Encounter for other preprocedural examination: Secondary | ICD-10-CM

## 2019-11-25 DIAGNOSIS — Z86010 Personal history of colonic polyps: Secondary | ICD-10-CM

## 2019-11-25 NOTE — Progress Notes (Signed)

## 2019-12-06 ENCOUNTER — Other Ambulatory Visit: Payer: Self-pay | Admitting: Internal Medicine

## 2019-12-06 ENCOUNTER — Ambulatory Visit (INDEPENDENT_AMBULATORY_CARE_PROVIDER_SITE_OTHER): Payer: Medicare Other

## 2019-12-06 DIAGNOSIS — Z1159 Encounter for screening for other viral diseases: Secondary | ICD-10-CM | POA: Diagnosis not present

## 2019-12-07 LAB — SARS CORONAVIRUS 2 (TAT 6-24 HRS): SARS Coronavirus 2: NEGATIVE

## 2019-12-08 ENCOUNTER — Encounter: Payer: Self-pay | Admitting: Internal Medicine

## 2019-12-09 ENCOUNTER — Encounter: Payer: Medicare Other | Admitting: Internal Medicine

## 2019-12-12 ENCOUNTER — Telehealth: Payer: Self-pay | Admitting: *Deleted

## 2019-12-12 NOTE — Telephone Encounter (Signed)
Attempted to contact pt. At mobile # unable to leave message,attempted home #,no voicemail set up.

## 2019-12-13 ENCOUNTER — Telehealth: Payer: Self-pay

## 2019-12-13 NOTE — Telephone Encounter (Signed)
Attempted to call pt regarding repeat COVID test for scheduled upcoming colonoscopy.  No answer at home #.  Unable to leave a message.  No answer on mobile #.  Voice mailbox full unable to leave message on mobile #.

## 2019-12-14 ENCOUNTER — Ambulatory Visit (AMBULATORY_SURGERY_CENTER): Payer: Medicare Other | Admitting: Internal Medicine

## 2019-12-14 ENCOUNTER — Encounter: Payer: Self-pay | Admitting: Internal Medicine

## 2019-12-14 ENCOUNTER — Other Ambulatory Visit: Payer: Self-pay

## 2019-12-14 VITALS — BP 109/52 | HR 47 | Temp 95.9°F | Resp 10 | Ht 72.0 in | Wt 184.0 lb

## 2019-12-14 DIAGNOSIS — D128 Benign neoplasm of rectum: Secondary | ICD-10-CM

## 2019-12-14 DIAGNOSIS — D123 Benign neoplasm of transverse colon: Secondary | ICD-10-CM

## 2019-12-14 DIAGNOSIS — D127 Benign neoplasm of rectosigmoid junction: Secondary | ICD-10-CM | POA: Diagnosis not present

## 2019-12-14 DIAGNOSIS — D120 Benign neoplasm of cecum: Secondary | ICD-10-CM | POA: Diagnosis not present

## 2019-12-14 DIAGNOSIS — D125 Benign neoplasm of sigmoid colon: Secondary | ICD-10-CM

## 2019-12-14 DIAGNOSIS — Z86010 Personal history of colonic polyps: Secondary | ICD-10-CM

## 2019-12-14 MED ORDER — SODIUM CHLORIDE 0.9 % IV SOLN: 500.0000 mL | Freq: Once | INTRAVENOUS | Status: DC

## 2019-12-14 NOTE — Op Note (Signed)
[facility] Patient Name: Mark Aguilar Procedure Date: 12/14/2019 11:13 AM MRN: [id number] Endoscopist: Gatha Mayer , MD Age: 69 Referring MD:  Date of Birth: August 27, 1951 Gender: Male Account #: [id number] Procedure:                Colonoscopy Indications:              Surveillance: Personal history of adenomatous                            polyps on last colonoscopy > 5 years ago Medicines:                Propofol per Anesthesia, Monitored Anesthesia Care Procedure:                Pre-Anesthesia Assessment:                           - Prior to the procedure, a History and Physical                            was performed, and patient medications and                            allergies were reviewed. The patient's tolerance of                            previous anesthesia was also reviewed. The risks                            and benefits of the procedure and the sedation                            options and risks were discussed with the patient.                            All questions were answered, and informed consent                            was obtained. Prior Anticoagulants: The patient has                            taken no previous anticoagulant or antiplatelet                            agents. ASA Grade Assessment: I - A normal, healthy                            patient. After reviewing the risks and benefits,                            the patient was deemed in satisfactory condition to                            undergo the procedure.  After obtaining informed consent, the colonoscope                            was passed under direct vision. Throughout the                            procedure, the patient's blood pressure, pulse, and                            oxygen saturations were monitored continuously. The                            Colonoscope was introduced through the anus and                            advanced to the  the cecum, identified by                            appendiceal orifice and ileocecal valve. The                            colonoscopy was performed without difficulty. The                            patient tolerated the procedure well. The quality                            of the bowel preparation was good. The ileocecal                            valve, appendiceal orifice, and rectum were                            photographed. The bowel preparation used was                            Miralax via split dose instruction. Scope In: 11:23:15 AM Scope Out: 11:53:08 AM Scope Withdrawal Time: 0 hours 23 minutes 41 seconds  Total Procedure Duration: 0 hours 29 minutes 53 seconds  Findings:                 The perianal and digital rectal examinations were                            normal. Pertinent negatives include normal prostate                            (size, shape, and consistency).                           A 15 mm polyp was found in the distal sigmoid                            colon. The polyp was pedunculated. The polyp was  removed with a hot snare. Resection and retrieval                            were complete. Verification of patient                            identification for the specimen was done. Estimated                            blood loss was minimal.                           14 sessile polyps were found in the sigmoid colon,                            transverse colon and cecum. The polyps were                            diminutive in size. These polyps were removed with                            a cold snare. Resection was complete, but the polyp                            tissue was only partially retrieved. Verification                            of patient identification for the specimen was                            done. Estimated blood loss was minimal.                           Multiple small-mouthed diverticula were found in                             the sigmoid colon.                           The exam was otherwise without abnormality on                            direct and retroflexion views. Complications:            No immediate complications. Estimated Blood Loss:     Estimated blood loss was minimal. Impression:               - One 15 mm polyp in the distal sigmoid colon,                            removed with a hot snare. Resected and retrieved.                           - 14 diminutive polyps in the sigmoid colon, in the  transverse colon and in the cecum, removed with a                            cold snare. Complete resection. Partial retrieval.                           - Diverticulosis in the sigmoid colon.                           - The examination was otherwise normal on direct                            and retroflexion views.                           - Personal history of colonic polyps. 7 adenomas                            2015, max 15 mm Recommendation:           - Patient has a contact number available for                            emergencies. The signs and symptoms of potential                            delayed complications were discussed with the                            patient. Return to normal activities tomorrow.                            Written discharge instructions were provided to the                            patient.                           - Resume previous diet.                           - Continue present medications.                           - No aspirin, ibuprofen, naproxen, or other                            non-steroidal anti-inflammatory drugs for 2 weeks                            after polyp removal.                           - Repeat colonoscopy is recommended for                            surveillance.  The colonoscopy date will be                            determined after pathology results from today's                             exam become available for review. Gatha Mayer, MD 12/14/2019 12:03:42 PM This report has been signed electronically.

## 2019-12-14 NOTE — Progress Notes (Signed)
Called to room to assist during endoscopic procedure.  Patient ID and intended procedure confirmed with present staff. Received instructions for my participation in the procedure from the performing physician.  

## 2019-12-14 NOTE — Patient Instructions (Addendum)
I found and removed 15 polyps today, 14 were small, one was medium-sized.  I will let you know pathology results and when to have another routine colonoscopy by mail and/or My Chart. I appreciate the opportunity to care for you. Gatha Mayer, MD, FACG  YOU HAD AN ENDOSCOPIC PROCEDURE TODAY AT [facility]:   Refer to the procedure report that was given to you for any specific questions about what was found during the examination.  If the procedure report does not answer your questions, please call your gastroenterologist to clarify.  If you requested that your care partner not be given the details of your procedure findings, then the procedure report has been included in a sealed envelope for you to review at your convenience later.  **Handouts given on diverticulosis and Polyps**  YOU SHOULD EXPECT: Some feelings of bloating in the abdomen. Passage of more gas than usual.  Walking can help get rid of the air that was put into your GI tract during the procedure and reduce the bloating. If you had a lower endoscopy (such as a colonoscopy or flexible sigmoidoscopy) you may notice spotting of blood in your stool or on the toilet paper. If you underwent a bowel prep for your procedure, you may not have a normal bowel movement for a few days.  Please Note:  You might notice some irritation and congestion in your nose or some drainage.  This is from the oxygen used during your procedure.  There is no need for concern and it should clear up in a day or so.  SYMPTOMS TO REPORT IMMEDIATELY:   Following lower endoscopy (colonoscopy or flexible sigmoidoscopy):  Excessive amounts of blood in the stool  Significant tenderness or worsening of abdominal pains  Swelling of the abdomen that is new, acute  Fever of 100F or higher    For urgent or emergent issues, a gastroenterologist can be reached at any hour by calling [phone].   DIET:  We do recommend a small meal at first,  but then you may proceed to your regular diet.  Drink plenty of fluids but you should avoid alcoholic beverages for 24 hours.  ACTIVITY:  You should plan to take it easy for the rest of today and you should NOT DRIVE or use heavy machinery until tomorrow (because of the sedation medicines used during the test).    FOLLOW UP: Our staff will call the number listed on your records 48-72 hours following your procedure to check on you and address any questions or concerns that you may have regarding the information given to you following your procedure. If we do not reach you, we will leave a message.  We will attempt to reach you two times.  During this call, we will ask if you have developed any symptoms of COVID 19. If you develop any symptoms (ie: fever, flu-like symptoms, shortness of breath, cough etc.) before then, please call [phone].  If you test positive for Covid 19 in the 2 weeks post procedure, please call and report this information to [place].    If any biopsies were taken you will be contacted by phone or by letter within the next 1-3 weeks.  Please call us at [phone] if you have not heard about the biopsies in 3 weeks.    SIGNATURES/CONFIDENTIALITY: You and/or your care partner have signed paperwork which will be entered into your electronic medical record.  These signatures attest to the fact that that the information  above on your After Visit Summary has been reviewed and is understood.  Full responsibility of the confidentiality of this discharge information lies with you and/or your care-partner. 

## 2019-12-14 NOTE — Progress Notes (Signed)
Pt. Reports no change in his medical or surgical history since his pre-visit 11/25/19.

## 2019-12-14 NOTE — Progress Notes (Signed)
Report to PACU, RN, vss, BBS= Clear.  

## 2019-12-16 ENCOUNTER — Telehealth: Payer: Self-pay

## 2019-12-16 NOTE — Telephone Encounter (Signed)
Mailbox was full - unable to leave message on follow up call.

## 2019-12-16 NOTE — Telephone Encounter (Signed)
  Follow up Call-  Call back number 12/14/2019  Post procedure Call Back phone  # [phone]  Permission to leave phone message Yes  Some recent data might be hidden     Patient questions:  Do you have a fever, pain , or abdominal swelling? No. Pain Score  0 *  Have you tolerated food without any problems? Yes.    Have you been able to return to your normal activities? Yes.    Do you have any questions about your discharge instructions: Diet   No. Medications  No. Follow up visit  No.  Do you have questions or concerns about your Care? No.  Actions: * If pain score is 4 or above: No action needed, pain <4. 1. Have you developed a fever since your procedure? no  2.   Have you had an respiratory symptoms (SOB or cough) since your procedure? no  3.   Have you tested positive for COVID 19 since your procedure no  4.   Have you had any family members/close contacts diagnosed with the COVID 19 since your procedure?  no   If yes to any of these questions please route to Joylene Trae, RN and Alphonsa Gin, Therapist, sports.

## 2019-12-22 ENCOUNTER — Encounter: Payer: Self-pay | Admitting: Internal Medicine

## 2020-01-05 DIAGNOSIS — Z0120 Encounter for dental examination and cleaning without abnormal findings: Secondary | ICD-10-CM | POA: Diagnosis not present

## 2020-02-23 DIAGNOSIS — H25013 Cortical age-related cataract, bilateral: Secondary | ICD-10-CM | POA: Diagnosis not present

## 2020-02-23 DIAGNOSIS — H2513 Age-related nuclear cataract, bilateral: Secondary | ICD-10-CM | POA: Diagnosis not present

## 2020-04-10 DIAGNOSIS — H25012 Cortical age-related cataract, left eye: Secondary | ICD-10-CM | POA: Diagnosis not present

## 2020-04-10 DIAGNOSIS — H2512 Age-related nuclear cataract, left eye: Secondary | ICD-10-CM | POA: Diagnosis not present

## 2020-04-10 DIAGNOSIS — H25812 Combined forms of age-related cataract, left eye: Secondary | ICD-10-CM | POA: Diagnosis not present

## 2020-05-03 HISTORY — PX: CATARACT EXTRACTION: SUR2

## 2020-05-08 DIAGNOSIS — H25811 Combined forms of age-related cataract, right eye: Secondary | ICD-10-CM | POA: Diagnosis not present

## 2020-05-08 DIAGNOSIS — H25011 Cortical age-related cataract, right eye: Secondary | ICD-10-CM | POA: Diagnosis not present

## 2020-05-08 DIAGNOSIS — H2511 Age-related nuclear cataract, right eye: Secondary | ICD-10-CM | POA: Diagnosis not present

## 2020-07-19 DIAGNOSIS — Z0120 Encounter for dental examination and cleaning without abnormal findings: Secondary | ICD-10-CM | POA: Diagnosis not present

## 2020-10-04 DIAGNOSIS — Z03818 Encounter for observation for suspected exposure to other biological agents ruled out: Secondary | ICD-10-CM | POA: Diagnosis not present

## 2020-10-04 DIAGNOSIS — Z20822 Contact with and (suspected) exposure to covid-19: Secondary | ICD-10-CM | POA: Diagnosis not present

## 2020-12-11 ENCOUNTER — Telehealth: Payer: Self-pay | Admitting: Family Medicine

## 2020-12-11 NOTE — Telephone Encounter (Signed)
Tried calling patient to  schedule Medicare Annual Wellness Visit (AWV) either virtually or in office.  No answer    Last AWV 10/20/2019  please schedule at anytime with [facility] Nurse Health Advisor 1 or 2   This should be a 45 minute visit.

## 2021-02-18 ENCOUNTER — Telehealth: Payer: Self-pay | Admitting: Family Medicine

## 2021-02-18 NOTE — Telephone Encounter (Signed)
Tried calling patient to  schedule Medicare Annual Wellness Visit (AWV) either virtually or in office.  No answer voice mail full    Last AWV 10/20/2019   please schedule at anytime with [facility] Nurse Health Advisor 1 or 2  Patient also needs appointment with pcp last appointment 07/29/2019  This should be a 45 minute visit.

## 2021-04-04 ENCOUNTER — Encounter: Payer: Self-pay | Admitting: Internal Medicine

## 2021-04-30 ENCOUNTER — Ambulatory Visit (INDEPENDENT_AMBULATORY_CARE_PROVIDER_SITE_OTHER): Payer: Medicare Other

## 2021-04-30 ENCOUNTER — Other Ambulatory Visit: Payer: Self-pay

## 2021-04-30 DIAGNOSIS — Z86010 Personal history of colonic polyps: Secondary | ICD-10-CM | POA: Diagnosis not present

## 2021-04-30 DIAGNOSIS — Z0000 Encounter for general adult medical examination without abnormal findings: Secondary | ICD-10-CM

## 2021-04-30 DIAGNOSIS — Z1211 Encounter for screening for malignant neoplasm of colon: Secondary | ICD-10-CM | POA: Diagnosis not present

## 2021-04-30 NOTE — Progress Notes (Addendum)
Virtual Visit via Telephone Note  I connected with  Mark Aguilar on 04/30/21 at 10:15 AM EDT by telephone and verified that I am speaking with the correct person using two identifiers.  Location: Patient: Home Provider: Office Persons participating in the virtual visit: patient/Nurse Health Advisor   I discussed the limitations, risks, security and privacy concerns of performing an evaluation and management service by telephone and the availability of in person appointments. The patient expressed understanding and agreed to proceed.  Interactive audio and video telecommunications were attempted between this nurse and patient, however failed, due to patient having technical difficulties OR patient did not have access to video capability.  We continued and completed visit with audio only.  Some vital signs may be absent or patient reported.   Willette Brace, LPN   Subjective:   Mark Aguilar is a 70 y.o. male who presents for Medicare Annual/Subsequent preventive examination.  Review of Systems     Cardiac Risk Factors include: advanced age (>52men, >74 women);male gender     Objective:    There were no vitals filed for this visit. There is no height or weight on file to calculate BMI.  Advanced Directives 04/30/2021 10/20/2019  Does Patient Have a Medical Advance Directive? No No  Would patient like information on creating a medical advance directive? No - Patient declined Yes (MAU/Ambulatory/Procedural Areas - Information given)    Current Medications (verified) Outpatient Encounter Medications as of 04/30/2021  Medication Sig   Multiple Vitamin (MULTIVITAMIN) tablet Take 1 tablet by mouth daily.   Omega-3 Fatty Acids (FISH OIL PO) Take 4 capsules by mouth daily.   EPINEPHrine (EPIPEN 2-PAK) 0.3 mg/0.3 mL SOAJ injection Inject 0.3 mLs (0.3 mg total) into the muscle once. (Patient not taking: No sig reported)   No facility-administered encounter medications on file as of  04/30/2021.    Allergies (verified) Bee venom   History: Past Medical History:  Diagnosis Date   Arthritis    Cancer ([place])    Skin   Cataract    forming    Personal history of colonic polyps - adenomas 12/23/2013   12/23/2013 7 polyps removed     Past Surgical History:  Procedure Laterality Date   CATARACT EXTRACTION Bilateral 05/03/2020   COLONOSCOPY     COSMETIC SURGERY     Skin   FRACTURE SURGERY     nose   Family History  Problem Relation Age of Onset   Arthritis Mother    Hypertension Mother    Colon cancer Neg Hx    Colon polyps Neg Hx    Esophageal cancer Neg Hx    Stomach cancer Neg Hx    Rectal cancer Neg Hx    Social History   Socioeconomic History   Marital status: Married    Spouse name: Not on file   Number of children: 0   Years of education: 4 years college   Highest education level: Bachelor's degree (e.g., BA, AB, BS)  Occupational History    Employer: [employer]   Tobacco Use   Smoking status: Former    Pack years: 0.00    Types: Cigars   Smokeless tobacco: Never  Substance and Sexual Activity   Alcohol use: Yes    Alcohol/week: 4.0 - 6.0 standard drinks    Types: 4 - 6 Glasses of wine per week    Comment: per week   Drug use: No   Sexual activity: Not on file  Other Topics Concern  Not on file  Social History Narrative   Still working full time as newspaper Set designer [employer]   Married   1 dog    Social Determinants of Health   Financial Resource Strain: Low Risk    Difficulty of Paying Living Expenses: Not hard at all  Food Insecurity: No Food Insecurity   Worried About Charity fundraiser in the Last Year: Never true   Arboriculturist in the Last Year: Never true  Transportation Needs: No Transportation Needs   Lack of Transportation (Medical): No   Lack of Transportation (Non-Medical): No  Physical Activity: Sufficiently Active   Days of Exercise per Week: 6 days   Minutes of Exercise per Session: 60 min   Stress: No Stress Concern Present   Feeling of Stress : Not at all  Social Connections: Moderately Integrated   Frequency of Communication with Friends and Family: More than three times a week   Frequency of Social Gatherings with Friends and Family: More than three times a week   Attends Religious Services: More than 4 times per year   Active Member of [employer] or Organizations: No   Attends Music therapist: Never   Marital Status: Married    Tobacco Counseling Counseling given: Not Answered   Clinical Intake:  Pre-visit preparation completed: Yes  Pain : No/denies pain     BMI - recorded: 24.95 Nutritional Risks: Non-healing wound Diabetes: No  How often do you need to have someone help you when you read instructions, pamphlets, or other written materials from your doctor or pharmacy?: 1 - Never  Diabetic?No  Interpreter Needed?: No  Information entered by :: Charlott Rakes, LPN   Activities of Daily Living In your present state of health, do you have any difficulty performing the following activities: 04/30/2021  Hearing? N  Vision? N  Difficulty concentrating or making decisions? N  Walking or climbing stairs? N  Dressing or bathing? N  Doing errands, shopping? N  Preparing Food and eating ? N  Using the Toilet? N  In the past six months, have you accidently leaked urine? N  Do you have problems with loss of bowel control? N  Managing your Medications? N  Managing your Finances? N  Housekeeping or managing your Housekeeping? N  Some recent data might be hidden    Patient Care Team: Eulas Post, MD as PCP - General (Family Medicine)  Indicate any recent Medical Services you may have received from other than [facility] providers in the past year (date may be approximate).     Assessment:   This is a routine wellness examination for Mark Aguilar.  Hearing/Vision screen Hearing Screening - Comments:: Pt denies any hearing issues  Vision Screening  - Comments:: Pt [facility] op[facility] for annual eye exams   Dietary issues and exercise activities discussed: Current Exercise Habits: Home exercise routine, Type of exercise: Other - see comments, Time (Minutes): > 60, Frequency (Times/Week): 6, Weekly Exercise (Minutes/Week): 0   Goals Addressed             This Visit's Progress    Patient Stated       None at this time        Depression Screen PHQ 2/9 Scores 04/30/2021 10/20/2019  PHQ - 2 Score 0 0    Fall Risk Fall Risk  04/30/2021 10/20/2019  Falls in the past year? 0 0  Number falls in past yr: 0 -  Injury with Fall? 0 -  Risk for fall due to : Impaired vision -  Follow up Falls prevention discussed -    FALL RISK PREVENTION PERTAINING TO THE HOME:  Any stairs in or around the home? Yes  If so, are there any without handrails? No  Home free of loose throw rugs in walkways, pet beds, electrical cords, etc? Yes  Adequate lighting in your home to reduce risk of falls? Yes   ASSISTIVE DEVICES UTILIZED TO PREVENT FALLS:  Life alert? No  Use of a cane, walker or w/c? No  Grab bars in the bathroom? Yes  Shower chair or bench in shower? No  Elevated toilet seat or a handicapped toilet? No   TIMED UP AND GO:  Was the test performed? No     Cognitive Function:     6CIT Screen 04/30/2021 10/20/2019  What Year? 0 points 0 points  What month? 0 points 0 points  What time? 0 points 0 points  Count back from 20 0 points 0 points  Months in reverse 0 points 0 points  Repeat phrase 0 points 0 points  Total Score 0 0    Immunizations Immunization History  Administered Date(s) Administered   Tdap 10/13/2013    TDAP status: Up to date  Flu Vaccine status: Declined, Education has been provided regarding the importance of this vaccine but patient still declined. Advised may receive this vaccine at local pharmacy or Health Dept. Aware to provide a copy of the vaccination record if obtained from  local pharmacy or Health Dept. Verbalized acceptance and understanding.  Pneumococcal vaccine status: Declined,  Education has been provided regarding the importance of this vaccine but patient still declined. Advised may receive this vaccine at local pharmacy or Health Dept. Aware to provide a copy of the vaccination record if obtained from local pharmacy or Health Dept. Verbalized acceptance and understanding.   Covid-19 vaccine status: Declined, Education has been provided regarding the importance of this vaccine but patient still declined. Advised may receive this vaccine at local pharmacy or Health Dept.or vaccine clinic. Aware to provide a copy of the vaccination record if obtained from local pharmacy or Health Dept. Verbalized acceptance and understanding.  Qualifies for Shingles Vaccine? Yes   Zostavax completed No   Shingrix Completed?: No.    Education has been provided regarding the importance of this vaccine. Patient has been advised to call insurance company to determine out of pocket expense if they have not yet received this vaccine. Advised may also receive vaccine at local pharmacy or Health Dept. Verbalized acceptance and understanding.  Screening Tests Health Maintenance  Topic Date Due   Hepatitis C Screening  Never done   Zoster Vaccines- Shingrix (1 of 2) Never done   COLONOSCOPY (Pts 45-50yrs Insurance coverage will need to be confirmed)  12/13/2020   COVID-19 Vaccine (1) 05/16/2021 (Originally 04/01/1956)   PNA vac Low Risk Adult (1 of 2 - PCV13) 04/30/2022 (Originally 04/01/2018)   INFLUENZA VACCINE  06/03/2021   TETANUS/TDAP  10/14/2023   HPV VACCINES  Aged Out    Health Maintenance  Health Maintenance Due  Topic Date Due   Hepatitis C Screening  Never done   Zoster Vaccines- Shingrix (1 of 2) Never done   COLONOSCOPY (Pts 45-15yrs Insurance coverage will need to be confirmed)  12/13/2020    Colorectal cancer screening: Type of screening: Colonoscopy. Completed  12/14/19. Repeat every 1 years order placed 04/30/21    Additional Screening:  Hepatitis C Screening: does qualify  Vision Screening: Recommended  annual ophthalmology exams for early detection of glaucoma and other disorders of the eye. Is the patient up to date with their annual eye exam?  Yes  Who is the provider or what is the name of the office in which the patient attends annual eye exams? [facility]  If pt is not established with a provider, would they like to be referred to a provider to establish care? No .   Dental Screening: Recommended annual dental exams for proper oral hygiene  Community Resource Referral / Chronic Care Management: CRR required this visit?  No   CCM required this visit?  No      Plan:     I have personally reviewed and noted the following in the patient's chart:   Medical and social history Use of alcohol, tobacco or illicit drugs  Current medications and supplements including opioid prescriptions. Patient is not currently taking opioid prescriptions. Functional ability and status Nutritional status Physical activity Advanced directives List of other physicians Hospitalizations, surgeries, and ER visits in previous 12 months Vitals Screenings to include cognitive, depression, and falls Referrals and appointments  In addition, I have reviewed and discussed with patient certain preventive protocols, quality metrics, and best practice recommendations. A written personalized care plan for preventive services as well as general preventive health recommendations were provided to patient.     Willette Brace, LPN   0/14/1030   Nurse Notes: None

## 2021-04-30 NOTE — Patient Instructions (Signed)
Mark Aguilar , Thank you for taking time to come for your Medicare Wellness Visit. I appreciate your ongoing commitment to your health goals. Please review the following plan we discussed and let me know if I can assist you in the future.   Screening recommendations/referrals: Colonoscopy: Order placed 04/30/21 office will call to set up appt at pt convenience  Recommended yearly ophthalmology/optometry visit for glaucoma screening and checkup Recommended yearly dental visit for hygiene and checkup  Vaccinations: Influenza vaccine: Declined Pneumococcal vaccine: Declined  Tdap vaccine: Done 10/13/13 repeat in 10 years 10/13/23 Shingles vaccine: Shingrix discussed. Please contact your pharmacy for coverage information.    Covid-19: Declined  and discussed  Advanced directives: Advance directive discussed with you today. Even though you declined this today please call our office should you change your mind and we can give you the proper paperwork for you to fill out.  Conditions/risks identified: None at this time   Next appointment: Follow up in one year for your annual wellness visit.   Preventive Care 68 Years and Older, Male Preventive care refers to lifestyle choices and visits with your health care provider that can promote health and wellness. What does preventive care include? A yearly physical exam. This is also called an annual well check. Dental exams once or twice a year. Routine eye exams. Ask your health care provider how often you should have your eyes checked. Personal lifestyle choices, including: Daily care of your teeth and gums. Regular physical activity. Eating a healthy diet. Avoiding tobacco and drug use. Limiting alcohol use. Practicing safe sex. Taking low doses of aspirin every day. Taking vitamin and mineral supplements as recommended by your health care provider. What happens during an annual well check? The services and screenings done by your health care  provider during your annual well check will depend on your age, overall health, lifestyle risk factors, and family history of disease. Counseling  Your health care provider may ask you questions about your: Alcohol use. Tobacco use. Drug use. Emotional well-being. Home and relationship well-being. Sexual activity. Eating habits. History of falls. Memory and ability to understand (cognition). Work and work Statistician. Screening  You may have the following tests or measurements: Height, weight, and BMI. Blood pressure. Lipid and cholesterol levels. These may be checked every 5 years, or more frequently if you are over 12 years old. Skin check. Lung cancer screening. You may have this screening every year starting at age 29 if you have a 30-pack-year history of smoking and currently smoke or have quit within the past 15 years. Fecal occult blood test (FOBT) of the stool. You may have this test every year starting at age 62. Flexible sigmoidoscopy or colonoscopy. You may have a sigmoidoscopy every 5 years or a colonoscopy every 10 years starting at age 22. Prostate cancer screening. Recommendations will vary depending on your family history and other risks. Hepatitis C blood test. Hepatitis B blood test. Sexually transmitted disease (STD) testing. Diabetes screening. This is done by checking your blood sugar (glucose) after you have not eaten for a while (fasting). You may have this done every 1-3 years. Abdominal aortic aneurysm (AAA) screening. You may need this if you are a current or former smoker. Osteoporosis. You may be screened starting at age 38 if you are at high risk. Talk with your health care provider about your test results, treatment options, and if necessary, the need for more tests. Vaccines  Your health care provider may recommend certain vaccines, such  as: Influenza vaccine. This is recommended every year. Tetanus, diphtheria, and acellular pertussis (Tdap, Td)  vaccine. You may need a Td booster every 10 years. Zoster vaccine. You may need this after age 49. Pneumococcal 13-valent conjugate (PCV13) vaccine. One dose is recommended after age 26. Pneumococcal polysaccharide (PPSV23) vaccine. One dose is recommended after age 31. Talk to your health care provider about which screenings and vaccines you need and how often you need them. This information is not intended to replace advice given to you by your health care provider. Make sure you discuss any questions you have with your health care provider. Document Released: 11/16/2015 Document Revised: 07/09/2016 Document Reviewed: 08/21/2015 Elsevier Interactive Patient Education  2017 [place] Prevention in the Home Falls can cause injuries. They can happen to people of all ages. There are many things you can do to make your home safe and to help prevent falls. What can I do on the outside of my home? Regularly fix the edges of walkways and driveways and fix any cracks. Remove anything that might make you trip as you walk through a door, such as a raised step or threshold. Trim any bushes or trees on the path to your home. Use bright outdoor lighting. Clear any walking paths of anything that might make someone trip, such as rocks or tools. Regularly check to see if handrails are loose or broken. Make sure that both sides of any steps have handrails. Any raised decks and porches should have guardrails on the edges. Have any leaves, snow, or ice cleared regularly. Use sand or salt on walking paths during winter. Clean up any spills in your garage right away. This includes oil or grease spills. What can I do in the bathroom? Use night lights. Install grab bars by the toilet and in the tub and shower. Do not use towel bars as grab bars. Use non-skid mats or decals in the tub or shower. If you need to sit down in the shower, use a plastic, non-slip stool. Keep the floor dry. Clean up any  water that spills on the floor as soon as it happens. Remove soap buildup in the tub or shower regularly. Attach bath mats securely with double-sided non-slip rug tape. Do not have throw rugs and other things on the floor that can make you trip. What can I do in the bedroom? Use night lights. Make sure that you have a light by your bed that is easy to reach. Do not use any sheets or blankets that are too big for your bed. They should not hang down onto the floor. Have a firm chair that has side arms. You can use this for support while you get dressed. Do not have throw rugs and other things on the floor that can make you trip. What can I do in the kitchen? Clean up any spills right away. Avoid walking on wet floors. Keep items that you use a lot in easy-to-reach places. If you need to reach something above you, use a strong step stool that has a grab bar. Keep electrical cords out of the way. Do not use floor polish or wax that makes floors slippery. If you must use wax, use non-skid floor wax. Do not have throw rugs and other things on the floor that can make you trip. What can I do with my stairs? Do not leave any items on the stairs. Make sure that there are handrails on both sides of the stairs and use  them. Fix handrails that are broken or loose. Make sure that handrails are as long as the stairways. Check any carpeting to make sure that it is firmly attached to the stairs. Fix any carpet that is loose or worn. Avoid having throw rugs at the top or bottom of the stairs. If you do have throw rugs, attach them to the floor with carpet tape. Make sure that you have a light switch at the top of the stairs and the bottom of the stairs. If you do not have them, ask someone to add them for you. What else can I do to help prevent falls? Wear shoes that: Do not have high heels. Have rubber bottoms. Are comfortable and fit you well. Are closed at the toe. Do not wear sandals. If you use a  stepladder: Make sure that it is fully opened. Do not climb a closed stepladder. Make sure that both sides of the stepladder are locked into place. Ask someone to hold it for you, if possible. Clearly mark and make sure that you can see: Any grab bars or handrails. First and last steps. Where the edge of each step is. Use tools that help you move around (mobility aids) if they are needed. These include: Canes. Walkers. Scooters. Crutches. Turn on the lights when you go into a dark area. Replace any light bulbs as soon as they burn out. Set up your furniture so you have a clear path. Avoid moving your furniture around. If any of your floors are uneven, fix them. If there are any pets around you, be aware of where they are. Review your medicines with your doctor. Some medicines can make you feel dizzy. This can increase your chance of falling. Ask your doctor what other things that you can do to help prevent falls. This information is not intended to replace advice given to you by your health care provider. Make sure you discuss any questions you have with your health care provider. Document Released: 08/16/2009 Document Revised: 03/27/2016 Document Reviewed: 11/24/2014 Elsevier Interactive Patient Education  2017 [employer].

## 2021-05-07 ENCOUNTER — Ambulatory Visit: Payer: Medicare Other | Admitting: Family Medicine

## 2021-06-19 DIAGNOSIS — M25572 Pain in left ankle and joints of left foot: Secondary | ICD-10-CM | POA: Diagnosis not present

## 2021-09-12 DIAGNOSIS — L821 Other seborrheic keratosis: Secondary | ICD-10-CM | POA: Diagnosis not present

## 2021-09-12 DIAGNOSIS — L905 Scar conditions and fibrosis of skin: Secondary | ICD-10-CM | POA: Diagnosis not present

## 2021-09-12 DIAGNOSIS — D485 Neoplasm of uncertain behavior of skin: Secondary | ICD-10-CM | POA: Diagnosis not present

## 2021-09-12 DIAGNOSIS — L814 Other melanin hyperpigmentation: Secondary | ICD-10-CM | POA: Diagnosis not present

## 2021-09-12 DIAGNOSIS — L570 Actinic keratosis: Secondary | ICD-10-CM | POA: Diagnosis not present

## 2021-11-06 ENCOUNTER — Encounter: Payer: Self-pay | Admitting: Internal Medicine

## 2021-12-12 ENCOUNTER — Ambulatory Visit (AMBULATORY_SURGERY_CENTER): Payer: Medicare Other | Admitting: *Deleted

## 2021-12-12 ENCOUNTER — Other Ambulatory Visit: Payer: Self-pay

## 2021-12-12 VITALS — Ht 72.0 in | Wt 175.0 lb

## 2021-12-12 DIAGNOSIS — Z86010 Personal history of colonic polyps: Secondary | ICD-10-CM

## 2021-12-12 NOTE — Progress Notes (Signed)

## 2021-12-23 ENCOUNTER — Encounter: Payer: Self-pay | Admitting: Internal Medicine

## 2021-12-26 ENCOUNTER — Encounter: Payer: Self-pay | Admitting: Internal Medicine

## 2021-12-26 ENCOUNTER — Ambulatory Visit (AMBULATORY_SURGERY_CENTER): Payer: Medicare Other | Admitting: Internal Medicine

## 2021-12-26 ENCOUNTER — Other Ambulatory Visit: Payer: Self-pay

## 2021-12-26 VITALS — BP 105/59 | HR 47 | Resp 18

## 2021-12-26 DIAGNOSIS — D124 Benign neoplasm of descending colon: Secondary | ICD-10-CM | POA: Diagnosis not present

## 2021-12-26 DIAGNOSIS — Z86010 Personal history of colonic polyps: Secondary | ICD-10-CM | POA: Diagnosis not present

## 2021-12-26 DIAGNOSIS — D125 Benign neoplasm of sigmoid colon: Secondary | ICD-10-CM

## 2021-12-26 DIAGNOSIS — D122 Benign neoplasm of ascending colon: Secondary | ICD-10-CM | POA: Diagnosis not present

## 2021-12-26 MED ORDER — SODIUM CHLORIDE 0.9 % IV SOLN: 500.0000 mL | Freq: Once | INTRAVENOUS | Status: DC

## 2021-12-26 NOTE — Progress Notes (Signed)
To Pacu, VSS. Report to [email or web address] 

## 2021-12-26 NOTE — Op Note (Signed)
[facility] Patient Name: Mark Aguilar Procedure Date: 12/26/2021 2:11 PM MRN: [id number] Endoscopist: Gatha Mayer , MD Age: 71 Referring MD:  Date of Birth: 04/21/1951 Gender: Male Account #: [id number] Procedure:                Colonoscopy Indications:              Surveillance: History of numerous (> 10) adenomas                            on last colonoscopy (< 3 yrs) Medicines:                Propofol per Anesthesia, Monitored Anesthesia Care Procedure:                Pre-Anesthesia Assessment:                           - Prior to the procedure, a History and Physical                            was performed, and patient medications and                            allergies were reviewed. The patient's tolerance of                            previous anesthesia was also reviewed. The risks                            and benefits of the procedure and the sedation                            options and risks were discussed with the patient.                            All questions were answered, and informed consent                            was obtained. Prior Anticoagulants: The patient has                            taken no previous anticoagulant or antiplatelet                            agents. ASA Grade Assessment: II - A patient with                            mild systemic disease. After reviewing the risks                            and benefits, the patient was deemed in                            satisfactory condition to undergo the procedure.  After obtaining informed consent, the colonoscope                            was passed under direct vision. Throughout the                            procedure, the patient's blood pressure, pulse, and                            oxygen saturations were monitored continuously. The                            Olympus CF-HQ190L [id number]) Colonoscope was                            introduced  through the anus and advanced to the the                            cecum, identified by appendiceal orifice and                            ileocecal valve. The colonoscopy was performed                            without difficulty. The patient tolerated the                            procedure well. The quality of the bowel                            preparation was good. The ileocecal valve,                            appendiceal orifice, and rectum were photographed.                            The bowel preparation used was Miralax via split                            dose instruction. Scope In: 2:26:04 PM Scope Out: 2:51:02 PM Scope Withdrawal Time: 0 hours 15 minutes 56 seconds  Total Procedure Duration: 0 hours 24 minutes 58 seconds  Findings:                 Four sessile polyps were found in the sigmoid                            colon, descending colon and ascending colon. The                            polyps were diminutive in size. These polyps were                            removed with a cold snare. Resection and retrieval  were complete. Verification of patient                            identification for the specimen was done. Estimated                            blood loss was minimal.                           Multiple diverticula were found in the sigmoid                            colon.                           The exam was otherwise without abnormality on                            direct and retroflexion views.                           Skin tags were found on perianal exam.                           The digital rectal exam was normal. Pertinent                            negatives include normal prostate (size, shape, and                            consistency). Complications:            No immediate complications. Estimated Blood Loss:     Estimated blood loss was minimal. Impression:               - Four diminutive polyps in the  sigmoid colon, in                            the descending colon and in the ascending colon,                            removed with a cold snare. Resected and retrieved.                           - Diverticulosis in the sigmoid colon.                           - The examination was otherwise normal on direct                            and retroflexion views.                           - Perianal skin tags found on perianal exam.                           -  Personal history of colonic polyps. 12/23/2013 7                            polyps removed, max 15 mm and all adenomas                           12/2019 14 polyps - tubular adenomas and 1 TV                            adenoma-                           21 adenomas total - suggests polyposis syndrome Recommendation:           - Patient has a contact number available for                            emergencies. The signs and symptoms of potential                            delayed complications were discussed with the                            patient. Return to normal activities tomorrow.                            Written discharge instructions were provided to the                            patient.                           - Resume previous diet.                           - Continue present medications.                           - Repeat colonoscopy date to be determined after                            pending pathology results are reviewed for                            surveillance.                           - ? needs genetic testing Gatha Mayer, MD 12/26/2021 3:07:25 PM This report has been signed electronically.

## 2021-12-26 NOTE — Progress Notes (Signed)
Called to room to assist during endoscopic procedure.  Patient ID and intended procedure confirmed with present staff. Received instructions for my participation in the procedure from the performing physician.  

## 2021-12-26 NOTE — Progress Notes (Signed)
[facility] History and Physical   Primary Care Physician:  Eulas Post, MD   Reason for Procedure:   Hx colon polyps  Plan:    colonoscopy     HPI: Mark Aguilar is a 71 y.o. male here for polyp surveillance exam   12/23/2013 7 polyps removed, max 15 mm and all adenomas  12/2019 14 polyps - tubular adenomas and 1 TV adenoma- repeat colonoscopy 2022 - 1 year 21 adenomas total - suggests polyposis syndrome  Past Medical History:  Diagnosis Date   Arthritis    Cancer ([facility])    Skin   Cataract    forming    Personal history of colonic polyps - adenomas 12/23/2013   12/23/2013 7 polyps removed      Past Surgical History:  Procedure Laterality Date   CATARACT EXTRACTION Bilateral 05/03/2020   COLONOSCOPY     COSMETIC SURGERY     Skin   FRACTURE SURGERY     nose    Prior to Admission medications   Medication Sig Start Date End Date Taking? Authorizing Provider  fluorouracil (EFUDEX) 5 % cream Apply topically. 09/12/21  Yes [provider]  Multiple Vitamin (MULTIVITAMIN) tablet Take 1 tablet by mouth daily.   Yes [provider]  Omega-3 Fatty Acids (FISH OIL PO) Take 4 capsules by mouth daily.   Yes [provider]  EPINEPHrine (EPIPEN 2-PAK) 0.3 mg/0.3 mL SOAJ injection Inject 0.3 mLs (0.3 mg total) into the muscle once. Patient not taking: Reported on 12/12/2021 09/06/13   Eulas Post, MD    Current Outpatient Medications  Medication Sig Dispense Refill   fluorouracil (EFUDEX) 5 % cream Apply topically.     Multiple Vitamin (MULTIVITAMIN) tablet Take 1 tablet by mouth daily.     Omega-3 Fatty Acids (FISH OIL PO) Take 4 capsules by mouth daily.     EPINEPHrine (EPIPEN 2-PAK) 0.3 mg/0.3 mL SOAJ injection Inject 0.3 mLs (0.3 mg total) into the muscle once. (Patient not taking: Reported on 12/12/2021) 1 Device 0   Current Facility-Administered Medications  Medication Dose Route Frequency Provider Last Rate Last Admin    0.9 %  sodium chloride infusion  500 mL Intravenous Once Gatha Mayer, MD        Allergies as of 12/26/2021 - Review Complete 12/26/2021  Allergen Reaction Noted   Bee venom  10/13/2013    Family History  Problem Relation Age of Onset   Arthritis Mother    Hypertension Mother    Colon cancer Neg Hx    Colon polyps Neg Hx    Esophageal cancer Neg Hx    Stomach cancer Neg Hx    Rectal cancer Neg Hx     Social History   Socioeconomic History   Marital status: Married    Spouse name: Not on file   Number of children: 0   Years of education: 4 years college   Highest education level: Bachelor's degree (e.g., BA, AB, BS)  Occupational History    Employer: [employer]   Tobacco Use   Smoking status: Former    Types: Cigars   Smokeless tobacco: Never  Substance and Sexual Activity   Alcohol use: Yes    Alcohol/week: 4.0 - 6.0 standard drinks    Types: 4 - 6 Glasses of wine per week    Comment: per week   Drug use: No   Sexual activity: Not on file  Other Topics Concern   Not on file  Social  History Narrative   Still working full time as Ship broker [employer]   Married   1 dog    Social Determinants of Radio broadcast assistant Strain: Low Risk    Difficulty of Paying Living Expenses: Not hard at all  Food Insecurity: No Food Insecurity   Worried About Charity fundraiser in the Last Year: Never true   Arboriculturist in the Last Year: Never true  Transportation Needs: No Transportation Needs   Lack of Transportation (Medical): No   Lack of Transportation (Non-Medical): No  Physical Activity: Sufficiently Active   Days of Exercise per Week: 6 days   Minutes of Exercise per Session: 60 min  Stress: No Stress Concern Present   Feeling of Stress : Not at all  Social Connections: Moderately Integrated   Frequency of Communication with Friends and Family: More than three times a week   Frequency of Social Gatherings with Friends and  Family: More than three times a week   Attends Religious Services: More than 4 times per year   Active Member of [employer] or Organizations: No   Attends Archivist Meetings: Never   Marital Status: Married  Human resources officer Violence: Not At Risk   Fear of Current or Ex-Partner: No   Emotionally Abused: No   Physically Abused: No   Sexually Abused: No    Review of Systems:  other review of systems negative except as mentioned in the HPI.  Physical Exam: Vital signs There were no vitals taken for this visit.  General:   Alert,  Well-developed, well-nourished, pleasant and cooperative in NAD Lungs:  Clear throughout to auscultation.   Heart:  Regular rate and rhythm; no murmurs, clicks, rubs,  or gallops. Abdomen:  Soft, nontender and nondistended. Normal bowel sounds.   Neuro/Psych:  Alert and cooperative. Normal mood and affect. A and O x 3   @Shalon Councilman  Simonne Maffucci, MD, [facility] [phone] (pager) 12/26/2021 2:16 PM@

## 2021-12-26 NOTE — Patient Instructions (Addendum)
I found and removed 4 small polyps today. I will let you know pathology results and when to have another routine colonoscopy by mail and/or My Chart.  I appreciate the opportunity to care for you. Mark Mayer, MD, FACG YOU HAD AN ENDOSCOPIC PROCEDURE TODAY AT [facility]:   Refer to the procedure report that was given to you for any specific questions about what was found during the examination.  If the procedure report does not answer your questions, please call your gastroenterologist to clarify.  If you requested that your care partner not be given the details of your procedure findings, then the procedure report has been included in a sealed envelope for you to review at your convenience later.  YOU SHOULD EXPECT: Some feelings of bloating in the abdomen. Passage of more gas than usual.  Walking can help get rid of the air that was put into your GI tract during the procedure and reduce the bloating. If you had a lower endoscopy (such as a colonoscopy or flexible sigmoidoscopy) you may notice spotting of blood in your stool or on the toilet paper. If you underwent a bowel prep for your procedure, you may not have a normal bowel movement for a few days.  Please Note:  You might notice some irritation and congestion in your nose or some drainage.  This is from the oxygen used during your procedure.  There is no need for concern and it should clear up in a day or so.  SYMPTOMS TO REPORT IMMEDIATELY:  Following lower endoscopy (colonoscopy or flexible sigmoidoscopy):  Excessive amounts of blood in the stool  Significant tenderness or worsening of abdominal pains  Swelling of the abdomen that is new, acute  Fever of 100F or higher   For urgent or emergent issues, a gastroenterologist can be reached at any hour by calling [phone]. Do not use MyChart messaging for urgent concerns.    DIET:  We do recommend a small meal at first, but then you may proceed to your  regular diet.  Drink plenty of fluids but you should avoid alcoholic beverages for 24 hours.  ACTIVITY:  You should plan to take it easy for the rest of today and you should NOT DRIVE or use heavy machinery until tomorrow (because of the sedation medicines used during the test).    FOLLOW UP: Our staff will call the number listed on your records 48-72 hours following your procedure to check on you and address any questions or concerns that you may have regarding the information given to you following your procedure. If we do not reach you, we will leave a message.  We will attempt to reach you two times.  During this call, we will ask if you have developed any symptoms of COVID 19. If you develop any symptoms (ie: fever, flu-like symptoms, shortness of breath, cough etc.) before then, please call [phone].  If you test positive for Covid 19 in the 2 weeks post procedure, please call and report this information to [place].    If any biopsies were taken you will be contacted by phone or by letter within the next 1-3 weeks.  Please call us at [phone] if you have not heard about the biopsies in 3 weeks.    SIGNATURES/CONFIDENTIALITY: You and/or your care partner have signed paperwork which will be entered into your electronic medical record.  These signatures attest to the fact that that the information above on your After Visit Summary  has been reviewed and is understood.  Full responsibility of the confidentiality of this discharge information lies with you and/or your care-partner.

## 2021-12-30 ENCOUNTER — Telehealth: Payer: Self-pay | Admitting: *Deleted

## 2021-12-30 NOTE — Telephone Encounter (Signed)
No answer left voice mail

## 2021-12-30 NOTE — Telephone Encounter (Signed)
Attempted 2nd f/u phone call. No answer. Mailbox is full, unable to leave message.

## 2022-01-05 ENCOUNTER — Encounter: Payer: Self-pay | Admitting: Internal Medicine

## 2022-05-13 ENCOUNTER — Ambulatory Visit (INDEPENDENT_AMBULATORY_CARE_PROVIDER_SITE_OTHER): Payer: Medicare Other

## 2022-05-13 VITALS — Ht 72.0 in | Wt 178.0 lb

## 2022-05-13 DIAGNOSIS — Z0000 Encounter for general adult medical examination without abnormal findings: Secondary | ICD-10-CM

## 2022-05-13 NOTE — Patient Instructions (Signed)
Mr. Mark Aguilar , Thank you for taking time to come for your Medicare Wellness Visit. I appreciate your ongoing commitment to your health goals. Please review the following plan we discussed and let me know if I can assist you in the future.   Screening recommendations/referrals: Colonoscopy: completed 12/26/2021, due 12/27/2023 Recommended yearly ophthalmology/optometry visit for glaucoma screening and checkup Recommended yearly dental visit for hygiene and checkup  Vaccinations: Influenza vaccine: decline Pneumococcal vaccine: decline Tdap vaccine: completed 10/13/2013, due 10/14/2023 Shingles vaccine: discussed   Covid-19:  decline  Advanced directives: Advance directive discussed with you today.   Conditions/risks identified: none  Next appointment: Follow up in one year for your annual wellness visit.   Preventive Care 69 Years and Older, Male Preventive care refers to lifestyle choices and visits with your health care provider that can promote health and wellness. What does preventive care include? A yearly physical exam. This is also called an annual well check. Dental exams once or twice a year. Routine eye exams. Ask your health care provider how often you should have your eyes checked. Personal lifestyle choices, including: Daily care of your teeth and gums. Regular physical activity. Eating a healthy diet. Avoiding tobacco and drug use. Limiting alcohol use. Practicing safe sex. Taking low doses of aspirin every day. Taking vitamin and mineral supplements as recommended by your health care provider. What happens during an annual well check? The services and screenings done by your health care provider during your annual well check will depend on your age, overall health, lifestyle risk factors, and family history of disease. Counseling  Your health care provider may ask you questions about your: Alcohol use. Tobacco use. Drug use. Emotional well-being. Home and  relationship well-being. Sexual activity. Eating habits. History of falls. Memory and ability to understand (cognition). Work and work Statistician. Screening  You may have the following tests or measurements: Height, weight, and BMI. Blood pressure. Lipid and cholesterol levels. These may be checked every 5 years, or more frequently if you are over 69 years old. Skin check. Lung cancer screening. You may have this screening every year starting at age 69 if you have a 30-pack-year history of smoking and currently smoke or have quit within the past 15 years. Fecal occult blood test (FOBT) of the stool. You may have this test every year starting at age 69. Flexible sigmoidoscopy or colonoscopy. You may have a sigmoidoscopy every 5 years or a colonoscopy every 10 years starting at age 69. Prostate cancer screening. Recommendations will vary depending on your family history and other risks. Hepatitis C blood test. Hepatitis B blood test. Sexually transmitted disease (STD) testing. Diabetes screening. This is done by checking your blood sugar (glucose) after you have not eaten for a while (fasting). You may have this done every 1-3 years. Abdominal aortic aneurysm (AAA) screening. You may need this if you are a current or former smoker. Osteoporosis. You may be screened starting at age 69 if you are at high risk. Talk with your health care provider about your test results, treatment options, and if necessary, the need for more tests. Vaccines  Your health care provider may recommend certain vaccines, such as: Influenza vaccine. This is recommended every year. Tetanus, diphtheria, and acellular pertussis (Tdap, Td) vaccine. You may need a Td booster every 10 years. Zoster vaccine. You may need this after age 69. Pneumococcal 13-valent conjugate (PCV13) vaccine. One dose is recommended after age 69. Pneumococcal polysaccharide (PPSV23) vaccine. One dose is recommended after age  69. Talk to your  health care provider about which screenings and vaccines you need and how often you need them. This information is not intended to replace advice given to you by your health care provider. Make sure you discuss any questions you have with your health care provider. Document Released: 11/16/2015 Document Revised: 07/09/2016 Document Reviewed: 08/21/2015 Elsevier Interactive Patient Education  2017 [place] Prevention in the Home Falls can cause injuries. They can happen to people of all ages. There are many things you can do to make your home safe and to help prevent falls. What can I do on the outside of my home? Regularly fix the edges of walkways and driveways and fix any cracks. Remove anything that might make you trip as you walk through a door, such as a raised step or threshold. Trim any bushes or trees on the path to your home. Use bright outdoor lighting. Clear any walking paths of anything that might make someone trip, such as rocks or tools. Regularly check to see if handrails are loose or broken. Make sure that both sides of any steps have handrails. Any raised decks and porches should have guardrails on the edges. Have any leaves, snow, or ice cleared regularly. Use sand or salt on walking paths during winter. Clean up any spills in your garage right away. This includes oil or grease spills. What can I do in the bathroom? Use night lights. Install grab bars by the toilet and in the tub and shower. Do not use towel bars as grab bars. Use non-skid mats or decals in the tub or shower. If you need to sit down in the shower, use a plastic, non-slip stool. Keep the floor dry. Clean up any water that spills on the floor as soon as it happens. Remove soap buildup in the tub or shower regularly. Attach bath mats securely with double-sided non-slip rug tape. Do not have throw rugs and other things on the floor that can make you trip. What can I do in the bedroom? Use night  lights. Make sure that you have a light by your bed that is easy to reach. Do not use any sheets or blankets that are too big for your bed. They should not hang down onto the floor. Have a firm chair that has side arms. You can use this for support while you get dressed. Do not have throw rugs and other things on the floor that can make you trip. What can I do in the kitchen? Clean up any spills right away. Avoid walking on wet floors. Keep items that you use a lot in easy-to-reach places. If you need to reach something above you, use a strong step stool that has a grab bar. Keep electrical cords out of the way. Do not use floor polish or wax that makes floors slippery. If you must use wax, use non-skid floor wax. Do not have throw rugs and other things on the floor that can make you trip. What can I do with my stairs? Do not leave any items on the stairs. Make sure that there are handrails on both sides of the stairs and use them. Fix handrails that are broken or loose. Make sure that handrails are as long as the stairways. Check any carpeting to make sure that it is firmly attached to the stairs. Fix any carpet that is loose or worn. Avoid having throw rugs at the top or bottom of the stairs. If you do have  throw rugs, attach them to the floor with carpet tape. Make sure that you have a light switch at the top of the stairs and the bottom of the stairs. If you do not have them, ask someone to add them for you. What else can I do to help prevent falls? Wear shoes that: Do not have high heels. Have rubber bottoms. Are comfortable and fit you well. Are closed at the toe. Do not wear sandals. If you use a stepladder: Make sure that it is fully opened. Do not climb a closed stepladder. Make sure that both sides of the stepladder are locked into place. Ask someone to hold it for you, if possible. Clearly mark and make sure that you can see: Any grab bars or handrails. First and last  steps. Where the edge of each step is. Use tools that help you move around (mobility aids) if they are needed. These include: Canes. Walkers. Scooters. Crutches. Turn on the lights when you go into a dark area. Replace any light bulbs as soon as they burn out. Set up your furniture so you have a clear path. Avoid moving your furniture around. If any of your floors are uneven, fix them. If there are any pets around you, be aware of where they are. Review your medicines with your doctor. Some medicines can make you feel dizzy. This can increase your chance of falling. Ask your doctor what other things that you can do to help prevent falls. This information is not intended to replace advice given to you by your health care provider. Make sure you discuss any questions you have with your health care provider. Document Released: 08/16/2009 Document Revised: 03/27/2016 Document Reviewed: 11/24/2014 Elsevier Interactive Patient Education  2017 [employer].

## 2022-05-13 NOTE — Progress Notes (Signed)
I connected with Mark Aguilar today by telephone and verified that I am speaking with the correct person using two identifiers. Location patient: home Location provider: work Persons participating in the virtual visit: Mark Aguilar, Mark Durand LPN.   I discussed the limitations, risks, security and privacy concerns of performing an evaluation and management service by telephone and the availability of in person appointments. I also discussed with the patient that there may be a patient responsible charge related to this service. The patient expressed understanding and verbally consented to this telephonic visit.    Interactive audio and video telecommunications were attempted between this provider and patient, however failed, due to patient having technical difficulties OR patient did not have access to video capability.  We continued and completed visit with audio only.     Vital signs may be patient reported or missing.  Subjective:   [name] is a 71 y.o. male who presents for Medicare Annual/Subsequent preventive examination.  Review of Systems     Cardiac Risk Factors include: advanced age (>42mn, >>79women)     Objective:    Today's Vitals   05/13/22 1056  Weight: 178 lb (80.7 kg)  Height: 6' (1.829 m)   Body mass index is 24.14 kg/m.     05/13/2022   11:00 AM 04/30/2021   10:27 AM 10/20/2019   10:38 AM  Advanced Directives  Does Patient Have a Medical Advance Directive? No No No  Would patient like information on creating a medical advance directive?  No - Patient declined Yes (MAU/Ambulatory/Procedural Areas - Information given)    Current Medications (verified) Outpatient Encounter Medications as of 05/13/2022  Medication Sig   fluorouracil (EFUDEX) 5 % cream Apply topically.   Multiple Vitamin (MULTIVITAMIN) tablet Take 1 tablet by mouth daily.   Omega-3 Fatty Acids (FISH OIL PO) Take 4 capsules by mouth daily.   EPINEPHrine (EPIPEN 2-PAK) 0.3 mg/0.3 mL  SOAJ injection Inject 0.3 mLs (0.3 mg total) into the muscle once. (Patient not taking: Reported on 12/12/2021)   No facility-administered encounter medications on file as of 05/13/2022.    Allergies (verified) Bee venom   History: Past Medical History:  Diagnosis Date   Arthritis    Cancer ([facility]    Skin   Cataract    forming    Personal history of colonic polyps - adenomas 12/23/2013   12/23/2013 7 polyps removed     Past Surgical History:  Procedure Laterality Date   CATARACT EXTRACTION Bilateral 05/03/2020   COLONOSCOPY     COSMETIC SURGERY     Skin   FRACTURE SURGERY     nose   Family History  Problem Relation Age of Onset   Arthritis Mother    Hypertension Mother    Colon cancer Neg Hx    Colon polyps Neg Hx    Esophageal cancer Neg Hx    Stomach cancer Neg Hx    Rectal cancer Neg Hx    Social History   Socioeconomic History   Marital status: Married    Spouse name: Not on file   Number of children: 0   Years of education: 4 years college   Highest education level: Bachelor's degree (e.g., BA, AB, BS)  Occupational History    Employer: [employer]   Tobacco Use   Smoking status: Former    Types: Cigars   Smokeless tobacco: Never  Vaping Use   Vaping Use: Never used  Substance and Sexual Activity   Alcohol use: Yes  Alcohol/week: 4.0 - 6.0 standard drinks of alcohol    Types: 4 - 6 Glasses of wine per week    Comment: per week   Drug use: No   Sexual activity: Not on file  Other Topics Concern   Not on file  Social History Narrative   Still working full time as newspaper Set designer [employer]   Married   1 dog    Social Determinants of Health   Financial Resource Strain: Low Risk  (05/13/2022)   Overall Financial Resource Strain (CARDIA)    Difficulty of Paying Living Expenses: Not hard at all  Food Insecurity: No Food Insecurity (05/13/2022)   Hunger Vital Sign    Worried About Running Out of Food in the Last Year: Never true     [place] in the Last Year: Never true  Transportation Needs: No Transportation Needs (05/13/2022)   PRAPARE - Hydrologist (Medical): No    Lack of Transportation (Non-Medical): No  Physical Activity: Sufficiently Active (05/13/2022)   Exercise Vital Sign    Days of Exercise per Week: 7 days    Minutes of Exercise per Session: 90 min  Stress: No Stress Concern Present (05/13/2022)   [place]    Feeling of Stress : Not at all  Social Connections: Moderately Integrated (04/30/2021)   Social Connection and Isolation Panel [NHANES]    Frequency of Communication with Friends and Family: More than three times a week    Frequency of Social Gatherings with Friends and Family: More than three times a week    Attends Religious Services: More than 4 times per year    Active Member of [employer] or Organizations: No    Attends Music therapist: Never    Marital Status: Married    Tobacco Counseling Counseling given: Not Answered   Clinical Intake:  Pre-visit preparation completed: Yes  Pain : No/denies pain     Nutritional Status: BMI of 19-24  Normal Nutritional Risks: None Diabetes: No  How often do you need to have someone help you when you read instructions, pamphlets, or other written materials from your doctor or pharmacy?: 1 - Never What is the last grade level you completed in school?: college  Diabetic? no  Interpreter Needed?: No  Information entered by :: NAllen LPN   Activities of Daily Living    05/13/2022   11:01 AM  In your present state of health, do you have any difficulty performing the following activities:  Hearing? 0  Vision? 0  Difficulty concentrating or making decisions? 0  Walking or climbing stairs? 0  Dressing or bathing? 0  Doing errands, shopping? 0  Preparing Food and eating ? N  Using the Toilet? N  In the past six months, have you  accidently leaked urine? N  Do you have problems with loss of bowel control? N  Managing your Medications? N  Managing your Finances? N  Housekeeping or managing your Housekeeping? N    Patient Care Team: Eulas Post, MD as PCP - General (Family Medicine)  Indicate any recent Medical Services you may have received from other than [facility] providers in the past year (date may be approximate).     Assessment:   This is a routine wellness examination for Ottis.  Hearing/Vision screen Vision Screening - Comments:: Regular eye exams, [facility]  Dietary issues and exercise activities discussed: Current Exercise Habits: Home exercise routine, Type  of exercise: Other - see comments (pickle ball), Time (Minutes): > 60, Frequency (Times/Week): 7, Weekly Exercise (Minutes/Week): 0   Goals Addressed             This Visit's Progress    Patient Stated       05/13/2022, no goals       Depression Screen    05/13/2022   11:00 AM 04/30/2021   10:26 AM 10/20/2019   10:43 AM  PHQ 2/9 Scores  PHQ - 2 Score 0 0 0    Fall Risk    05/13/2022   11:00 AM 04/30/2021   10:28 AM 10/20/2019   10:42 AM  Fall Risk   Falls in the past year? 0 0 0  Number falls in past yr: 0 0   Injury with Fall? 0 0   Risk for fall due to : No Fall Risks Impaired vision   Follow up Falls evaluation completed;Education provided;Falls prevention discussed Falls prevention discussed     FALL RISK PREVENTION PERTAINING TO THE HOME:  Any stairs in or around the home? Yes  If so, are there any without handrails? No  Home free of loose throw rugs in walkways, pet beds, electrical cords, etc? Yes  Adequate lighting in your home to reduce risk of falls? Yes   ASSISTIVE DEVICES UTILIZED TO PREVENT FALLS:  Life alert? No  Use of a cane, walker or w/c? No  Grab bars in the bathroom? No  Shower chair or bench in shower? No  Elevated toilet seat or a handicapped toilet? No   TIMED UP AND GO:  Was the  test performed? No .      Cognitive Function:        05/13/2022   11:03 AM 04/30/2021   10:30 AM 10/20/2019   12:52 PM  6CIT Screen  What Year? 0 points 0 points 0 points  What month? 0 points 0 points 0 points  What time? 0 points 0 points 0 points  Count back from 20 0 points 0 points 0 points  Months in reverse 0 points 0 points 0 points  Repeat phrase 0 points 0 points 0 points  Total Score 0 points 0 points 0 points    Immunizations Immunization History  Administered Date(s) Administered   Tdap 10/13/2013    TDAP status: Up to date  Flu Vaccine status: Declined, Education has been provided regarding the importance of this vaccine but patient still declined. Advised may receive this vaccine at local pharmacy or Health Dept. Aware to provide a copy of the vaccination record if obtained from local pharmacy or Health Dept. Verbalized acceptance and understanding.  Pneumococcal vaccine status: Declined,  Education has been provided regarding the importance of this vaccine but patient still declined. Advised may receive this vaccine at local pharmacy or Health Dept. Aware to provide a copy of the vaccination record if obtained from local pharmacy or Health Dept. Verbalized acceptance and understanding.   Covid-19 vaccine status: Declined, Education has been provided regarding the importance of this vaccine but patient still declined. Advised may receive this vaccine at local pharmacy or Health Dept.or vaccine clinic. Aware to provide a copy of the vaccination record if obtained from local pharmacy or Health Dept. Verbalized acceptance and understanding.  Qualifies for Shingles Vaccine? Yes   Zostavax completed No   Shingrix Completed?: No.    Education has been provided regarding the importance of this vaccine. Patient has been advised to call insurance company to determine out of  pocket expense if they have not yet received this vaccine. Advised may also receive vaccine at local  pharmacy or Health Dept. Verbalized acceptance and understanding.  Screening Tests Health Maintenance  Topic Date Due   COVID-19 Vaccine (1) Never done   Hepatitis C Screening  Never done   Zoster Vaccines- Shingrix (1 of 2) Never done   Pneumonia Vaccine 21+ Years old (1 - PCV) Never done   INFLUENZA VACCINE  06/03/2022   TETANUS/TDAP  10/14/2023   COLONOSCOPY (Pts 45-60yr Insurance coverage will need to be confirmed)  12/27/2023   HPV VACCINES  Aged Out    Health Maintenance  Health Maintenance Due  Topic Date Due   COVID-19 Vaccine (1) Never done   Hepatitis C Screening  Never done   Zoster Vaccines- Shingrix (1 of 2) Never done   Pneumonia Vaccine 69 Years old (1 - PCV) Never done    Colorectal cancer screening: Type of screening: Colonoscopy. Completed 12/26/2021. Repeat every 2 years  Lung Cancer Screening: (Low Dose CT Chest recommended if Age 69-80years, 30 pack-year currently smoking OR have quit w/in 15years.) does not qualify.   Lung Cancer Screening Referral: no  Additional Screening:  Hepatitis C Screening: does qualify;   Vision Screening: Recommended annual ophthalmology exams for early detection of glaucoma and other disorders of the eye. Is the patient up to date with their annual eye exam?  Yes  Who is the provider or what is the name of the [facility] which the patient [facility] is not established with a provider, would they like to be referred to a provider to establish care? No .   Dental Screening: Recommended annual dental exams for proper oral hygiene  Community Resource Referral / Chronic Care Management: CRR required this visit?  No   CCM required this visit?  No      Plan:     I have personally reviewed and noted the following in the patient's chart:   Medical and social history Use of alcohol, tobacco or illicit drugs  Current medications and supplements including opioid prescriptions. Patient  is not currently taking opioid prescriptions. Functional ability and status Nutritional status Physical activity Advanced directives List of other physicians Hospitalizations, surgeries, and ER visits in previous 12 months Vitals Screenings to include cognitive, depression, and falls Referrals and appointments  In addition, I have reviewed and discussed with patient certain preventive protocols, quality metrics, and best practice recommendations. A written personalized care plan for preventive services as well as general preventive health recommendations were provided to patient.     NKellie Simmering LPN   [id number]  Nurse Notes: none  Due to this being a virtual visit, the after visit summary with patients personalized plan was offered to patient via mail or my-chart. Patient would like to access on my-chart

## 2022-05-19 ENCOUNTER — Encounter: Payer: Self-pay | Admitting: Family Medicine

## 2022-05-19 ENCOUNTER — Ambulatory Visit (INDEPENDENT_AMBULATORY_CARE_PROVIDER_SITE_OTHER): Payer: Medicare Other | Admitting: Family Medicine

## 2022-05-19 VITALS — BP 146/78 | HR 52 | Temp 97.9°F | Ht 72.0 in | Wt 184.4 lb

## 2022-05-19 DIAGNOSIS — R030 Elevated blood-pressure reading, without diagnosis of hypertension: Secondary | ICD-10-CM | POA: Diagnosis not present

## 2022-05-19 NOTE — Progress Notes (Signed)
Established Patient Office Visit  Subjective   Patient ID: Mark Aguilar, male    DOB: 1950/11/21  Age: 71 y.o. MRN: [id number]  Chief Complaint  Patient presents with   Follow-up    HPI   Danarius is seen for follow-up.  Not seen here in almost 3 years.  He has history of adenoma colon polyps and had recent colonoscopy with recommended 2-year follow-up.  Does have history of borderline elevated blood pressure in the past.  Suspects whitecoat syndrome.  Does have home blood pressure cuff but not monitoring regularly.  He has become very engaged with playing pickle ball past couple years.  Place frequently 2 hours/day.  Has lost some weight and feels well overall.  He and his wife enjoy traveling to [place] yearly.  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  Non-smoker.  Drinks usually 1 glass of wine per day  Past Medical History:  Diagnosis Date   Arthritis    Cancer [facility])    Skin   Cataract    forming    Personal history of colonic polyps - adenomas 12/23/2013   12/23/2013 7 polyps removed     Past Surgical History:  Procedure Laterality Date   CATARACT EXTRACTION Bilateral 05/03/2020   COLONOSCOPY     COSMETIC SURGERY     Skin   FRACTURE SURGERY     nose    reports that he has quit smoking. His smoking use included cigars. He has never used smokeless tobacco. He reports current alcohol use of about 4.0 - 6.0 standard drinks of alcohol per week. He reports that he does not use drugs. family history includes Arthritis in his mother; Hypertension in his mother. Allergies  Allergen Reactions   Bee Venom     Review of Systems  Constitutional:  Negative for malaise/fatigue.  Eyes:  Negative for blurred vision.  Respiratory:  Negative for shortness of breath.   Cardiovascular:  Negative for chest pain.  Neurological:  Negative for dizziness, weakness and headaches.      Objective:     BP (!) 146/78  (BP Location: Left Arm, Patient Position: Sitting, Cuff Size: Normal)   Pulse (!) 52   Temp 97.9 F (36.6 C) (Oral)   Ht 6' (1.829 m)   Wt 184 lb 6.4 oz (83.6 kg)   SpO2 100%   BMI 25.01 kg/m  BP Readings from Last 3 Encounters:  05/19/22 (!) 146/78  12/26/21 (!) 105/59  12/14/19 (!) 109/52   Wt Readings from Last 3 Encounters:  05/19/22 184 lb 6.4 oz (83.6 kg)  05/13/22 178 lb (80.7 kg)  12/12/21 175 lb (79.4 kg)      Physical Exam Vitals reviewed.  Constitutional:      Appearance: Normal appearance.  Cardiovascular:     Rate and Rhythm: Normal rate and regular rhythm.  Neurological:     Mental Status: He is alert.      No results found for any visits on 05/19/22.    The ASCVD Risk score (Arnett DK, et al., 2019) failed to calculate for the following reasons:   Cannot find a previous HDL lab   Cannot find a previous total cholesterol lab    Assessment & Plan:   Problem List Items Addressed This Visit   None Visit Diagnoses     Elevated blood pressure reading    -  Primary     Repeat  blood pressure left arm seated after rest 150/70  -Recommend regular home monitoring and be in touch if consistently greater than 140/90 -Discussed importance of regular exercise and weight control -Try to keep sodium intake less than 2500 mg daily -We recommend he bring his blood pressure cuff at some point to test with ours for accuracy.  -We did discuss preventative vaccines such as Shingrix and Prevnar 20.  He declines but will consider getting this at the pharmacy -Also discussed other screening such as for prostate cancer but he declines at this time.  No follow-ups on file.    Carolann Littler, MD

## 2022-05-19 NOTE — Patient Instructions (Addendum)
Consider bringing in your BP monitor at some point to compare with ours.    Be in touch if BP consistently > 140/90.

## 2022-05-30 DIAGNOSIS — H524 Presbyopia: Secondary | ICD-10-CM | POA: Diagnosis not present

## 2022-10-08 DIAGNOSIS — L570 Actinic keratosis: Secondary | ICD-10-CM | POA: Diagnosis not present

## 2022-10-08 DIAGNOSIS — D485 Neoplasm of uncertain behavior of skin: Secondary | ICD-10-CM | POA: Diagnosis not present

## 2022-10-08 DIAGNOSIS — L820 Inflamed seborrheic keratosis: Secondary | ICD-10-CM | POA: Diagnosis not present

## 2022-10-08 DIAGNOSIS — L821 Other seborrheic keratosis: Secondary | ICD-10-CM | POA: Diagnosis not present

## 2022-10-08 DIAGNOSIS — L814 Other melanin hyperpigmentation: Secondary | ICD-10-CM | POA: Diagnosis not present

## 2022-12-23 DIAGNOSIS — K080 Exfoliation of teeth due to systemic causes: Secondary | ICD-10-CM | POA: Diagnosis not present

## 2023-08-05 DIAGNOSIS — K080 Exfoliation of teeth due to systemic causes: Secondary | ICD-10-CM | POA: Diagnosis not present

## 2023-08-19 DIAGNOSIS — Z961 Presence of intraocular lens: Secondary | ICD-10-CM | POA: Diagnosis not present

## 2023-08-19 DIAGNOSIS — H26493 Other secondary cataract, bilateral: Secondary | ICD-10-CM | POA: Diagnosis not present

## 2023-09-10 DIAGNOSIS — H26492 Other secondary cataract, left eye: Secondary | ICD-10-CM | POA: Diagnosis not present

## 2023-09-10 DIAGNOSIS — H26491 Other secondary cataract, right eye: Secondary | ICD-10-CM | POA: Diagnosis not present

## 2023-09-10 DIAGNOSIS — H26493 Other secondary cataract, bilateral: Secondary | ICD-10-CM | POA: Diagnosis not present

## 2023-10-12 DIAGNOSIS — D224 Melanocytic nevi of scalp and neck: Secondary | ICD-10-CM | POA: Diagnosis not present

## 2023-10-12 DIAGNOSIS — L814 Other melanin hyperpigmentation: Secondary | ICD-10-CM | POA: Diagnosis not present

## 2023-10-12 DIAGNOSIS — D225 Melanocytic nevi of trunk: Secondary | ICD-10-CM | POA: Diagnosis not present

## 2023-10-12 DIAGNOSIS — L570 Actinic keratosis: Secondary | ICD-10-CM | POA: Diagnosis not present

## 2023-10-12 DIAGNOSIS — L430 Hypertrophic lichen planus: Secondary | ICD-10-CM | POA: Diagnosis not present

## 2023-10-12 DIAGNOSIS — D485 Neoplasm of uncertain behavior of skin: Secondary | ICD-10-CM | POA: Diagnosis not present

## 2023-10-12 DIAGNOSIS — D2262 Melanocytic nevi of left upper limb, including shoulder: Secondary | ICD-10-CM | POA: Diagnosis not present

## 2023-12-11 ENCOUNTER — Telehealth: Payer: Self-pay | Admitting: Family Medicine

## 2023-12-16 ENCOUNTER — Encounter: Payer: Self-pay | Admitting: Internal Medicine

## 2024-03-08 DIAGNOSIS — K080 Exfoliation of teeth due to systemic causes: Secondary | ICD-10-CM | POA: Diagnosis not present

## 2024-05-20 ENCOUNTER — Encounter: Payer: Self-pay | Admitting: Internal Medicine

## 2024-06-08 ENCOUNTER — Encounter: Payer: Self-pay | Admitting: Internal Medicine

## 2024-06-08 ENCOUNTER — Ambulatory Visit (AMBULATORY_SURGERY_CENTER): Payer: Self-pay

## 2024-06-08 VITALS — Ht 72.0 in | Wt 180.0 lb

## 2024-06-08 DIAGNOSIS — Z860100 Personal history of colon polyps, unspecified: Secondary | ICD-10-CM

## 2024-06-08 NOTE — Progress Notes (Signed)

## 2024-06-23 DIAGNOSIS — Z961 Presence of intraocular lens: Secondary | ICD-10-CM | POA: Diagnosis not present

## 2024-06-23 DIAGNOSIS — H26492 Other secondary cataract, left eye: Secondary | ICD-10-CM | POA: Diagnosis not present

## 2024-06-23 DIAGNOSIS — D126 Benign neoplasm of colon, unspecified: Secondary | ICD-10-CM | POA: Insufficient documentation

## 2024-06-23 DIAGNOSIS — H18593 Other hereditary corneal dystrophies, bilateral: Secondary | ICD-10-CM | POA: Diagnosis not present

## 2024-06-23 NOTE — Progress Notes (Unsigned)
 [facility] History and Physical   Primary Care Physician:  Micheal Wolm ORN, MD   Reason for Procedure:    Encounter Diagnoses  Name Primary?   Colonic adenomatous polyposis of unknown etiology Yes   Hx of colonic polyps      Plan:    Colonoscopy     HPI: Mark Aguilar is a 73 y.o. male with a history of multiple adenomatous colon polyps.  Polyp history as follows: 12/23/2013 7 polyps removed, max 15 mm and all adenomas  12/2019 14 polyps - tubular adenomas and 1 TV adenoma- repeat colonoscopy 2022  2/23 4 small adenomas recall 2025 25 adenomas total - suggests polyposis syndrome - no children - not interested in genetic testing    Past Medical History:  Diagnosis Date   Arthritis    Cancer ([facility])    Skin   Cataract    forming    Personal history of colonic polyps - adenomas 12/23/2013   12/23/2013 7 polyps removed      Past Surgical History:  Procedure Laterality Date   CATARACT EXTRACTION Bilateral 05/03/2020   COLONOSCOPY     COSMETIC SURGERY     Skin   FRACTURE SURGERY     nose     Current Outpatient Medications  Medication Sig Dispense Refill   EPINEPHrine  (EPIPEN  2-PAK) 0.3 mg/0.3 mL SOAJ injection Inject 0.3 mLs (0.3 mg total) into the muscle once. 1 Device 0   metroNIDAZOLE (METROCREAM) 0.75 % cream Apply 1 Application topically daily.     Multiple Vitamin (MULTIVITAMIN) tablet Take 1 tablet by mouth daily.     Omega-3 Fatty Acids (FISH OIL PO) Take 4 capsules by mouth daily.     No current facility-administered medications for this visit.    Allergies as of 06/24/2024 - Review Complete 06/08/2024  Allergen Reaction Noted   Bee venom  10/13/2013    Family History  Problem Relation Age of Onset   Arthritis Mother    Hypertension Mother    Colon cancer Neg Hx    Colon polyps Neg Hx    Esophageal cancer Neg Hx    Stomach cancer Neg Hx    Rectal cancer Neg Hx     Social History   Socioeconomic History   Marital status:  Married    Spouse name: Not on file   Number of children: 0   Years of education: 4 years college   Highest education level: Bachelor's degree (e.g., BA, AB, BS)  Occupational History    Employer: [employer]   Tobacco Use   Smoking status: Former    Types: Cigars   Smokeless tobacco: Never  Vaping Use   Vaping status: Never Used  Substance and Sexual Activity   Alcohol use: Yes    Alcohol/week: 4.0 - 6.0 standard drinks of alcohol    Types: 4 - 6 Glasses of wine per week    Comment: per week   Drug use: No   Sexual activity: Not on file  Other Topics Concern   Not on file  Social History Narrative   Still working full time as newspaper Development worker, [employer]   Married   1 dog    Social Drivers of Health   Financial Resource Strain: Low Risk  (05/13/2022)   Overall Financial Resource Strain (CARDIA)    Difficulty of Paying Living Expenses: Not hard at all  Food Insecurity: No Food Insecurity (05/13/2022)   Hunger Vital Sign    Worried About Running Out of  Food in the Last Year: Never true    Ran Out of Food in the Last Year: Never true  Transportation Needs: No Transportation Needs (05/13/2022)   PRAPARE - Administrator, Civil Service (Medical): No    Lack of Transportation (Non-Medical): No  Physical Activity: Sufficiently Active (05/13/2022)   Exercise Vital Sign    Days of Exercise per Week: 7 days    Minutes of Exercise per Session: 90 min  Stress: No Stress Concern Present (05/13/2022)   [employer] of Occupational Health - Occupational Stress Questionnaire    Feeling of Stress : Not at all  Social Connections: Moderately Integrated (04/30/2021)   Social Connection and Isolation Panel    Frequency of Communication with Friends and Family: More than three times a week    Frequency of Social Gatherings with Friends and Family: More than three times a week    Attends Religious Services: More than 4 times per year    Active Member of [employer]  or Organizations: No    Attends Banker Meetings: Never    Marital Status: Married  Catering manager Violence: Not At Risk (04/30/2021)   Humiliation, Afraid, Rape, and Kick questionnaire    Fear of Current or Ex-Partner: No    Emotionally Abused: No    Physically Abused: No    Sexually Abused: No    Review of Systems: Positive for *** All other review of systems negative except as mentioned in the HPI.  Physical Exam: Vital signs There were no vitals taken for this visit.  General:   Alert,  Well-developed, well-nourished, pleasant and cooperative in NAD Lungs:  Clear throughout to auscultation.   Heart:  Regular rate and rhythm; no murmurs, clicks, rubs,  or gallops. Abdomen:  Soft, nontender and nondistended. Normal bowel sounds.   Neuro/Psych:  Alert and cooperative. Normal mood and affect. A and O x 3   @Mark Aguilar  CHARLENA Commander, MD, [facility] [phone] (pager) 06/23/2024 8:59 PM@

## 2024-06-24 ENCOUNTER — Ambulatory Visit: Payer: Self-pay | Admitting: Internal Medicine

## 2024-06-24 ENCOUNTER — Encounter: Payer: Self-pay | Admitting: Internal Medicine

## 2024-06-24 VITALS — BP 130/72 | HR 41 | Temp 97.5°F | Resp 9 | Ht 72.0 in | Wt 180.0 lb

## 2024-06-24 DIAGNOSIS — K6289 Other specified diseases of anus and rectum: Secondary | ICD-10-CM

## 2024-06-24 DIAGNOSIS — Z1211 Encounter for screening for malignant neoplasm of colon: Secondary | ICD-10-CM

## 2024-06-24 DIAGNOSIS — K5730 Diverticulosis of large intestine without perforation or abscess without bleeding: Secondary | ICD-10-CM | POA: Diagnosis not present

## 2024-06-24 DIAGNOSIS — K644 Residual hemorrhoidal skin tags: Secondary | ICD-10-CM

## 2024-06-24 DIAGNOSIS — D125 Benign neoplasm of sigmoid colon: Secondary | ICD-10-CM

## 2024-06-24 DIAGNOSIS — Z860101 Personal history of adenomatous and serrated colon polyps: Secondary | ICD-10-CM | POA: Diagnosis not present

## 2024-06-24 DIAGNOSIS — Z860100 Personal history of colon polyps, unspecified: Secondary | ICD-10-CM

## 2024-06-24 DIAGNOSIS — D126 Benign neoplasm of colon, unspecified: Secondary | ICD-10-CM

## 2024-06-24 MED ORDER — SODIUM CHLORIDE 0.9 % IV SOLN: 500.0000 mL | Freq: Once | INTRAVENOUS | Status: DC

## 2024-06-24 NOTE — Progress Notes (Signed)
 Sed[place], gd SR, tolerated procedure well, VSS, report to RN

## 2024-06-24 NOTE — Patient Instructions (Addendum)
 Two small polyps removed today.  You still have diverticulosis - thickened muscle rings and pouches in the colon wall. Please read the handout about this condition.  I will let you know pathology results and when to have another routine colonoscopy by mail and/or My Chart.  I appreciate the opportunity to care for you. Lupita CHARLENA Commander, MD, [facility]   Discharge instructions given. Handouts on polyps and Diverticulosis. Resume previous medications. YOU HAD AN ENDOSCOPIC PROCEDURE TODAY AT THE [facility]:   Refer to the procedure report that was given to you for any specific questions about what was found during the examination.  If the procedure report does not answer your questions, please call your gastroenterologist to clarify.  If you requested that your care partner not be given the details of your procedure findings, then the procedure report has been included in a sealed envelope for you to review at your convenience later.  YOU SHOULD EXPECT: Some feelings of bloating in the abdomen. Passage of more gas than usual.  Walking can help get rid of the air that was put into your GI tract during the procedure and reduce the bloating. If you had a lower endoscopy (such as a colonoscopy or flexible sigmoidoscopy) you may notice spotting of blood in your stool or on the toilet paper. If you underwent a bowel prep for your procedure, you may not have a normal bowel movement for a few days.  Please Note:  You might notice some irritation and congestion in your nose or some drainage.  This is from the oxygen used during your procedure.  There is no need for concern and it should clear up in a day or so.  SYMPTOMS TO REPORT IMMEDIATELY:  Following lower endoscopy (colonoscopy or flexible sigmoidoscopy):  Excessive amounts of blood in the stool  Significant tenderness or worsening of abdominal pains  Swelling of the abdomen that is new, acute  Fever of 100F or higher   For urgent or  emergent issues, a gastroenterologist can be reached at any hour by calling [phone]. Do not use MyChart messaging for urgent concerns.    DIET:  We do recommend a small meal at first, but then you may proceed to your regular diet.  Drink plenty of fluids but you should avoid alcoholic beverages for 24 hours.  ACTIVITY:  You should plan to take it easy for the rest of today and you should NOT DRIVE or use heavy machinery until tomorrow (because of the sedation medicines used during the test).    FOLLOW UP: Our staff will call the number listed on your records the next business day following your procedure.  We will call around 7:15- 8:00 am to check on you and address any questions or concerns that you may have regarding the information given to you following your procedure. If we do not reach you, we will leave a message.     If any biopsies were taken you will be contacted by phone or by letter within the next 1-3 weeks.  Please call us  at [phone] if you have not heard about the biopsies in 3 weeks.    SIGNATURES/CONFIDENTIALITY: You and/or your care partner have signed paperwork which will be entered into your electronic medical record.  These signatures attest to the fact that that the information above on your After Visit Summary has been reviewed and is understood.  Full responsibility of the confidentiality of this discharge information lies with you and/or your care-partner.

## 2024-06-24 NOTE — Progress Notes (Signed)
 VS by DT  Pt's states no medical or surgical changes since previsit or office visit.

## 2024-06-24 NOTE — Op Note (Addendum)
 [facility] Patient Name: Mark Aguilar Procedure Date: 06/24/2024 11:03 AM MRN: [id number] Endoscopist: Lupita FORBES Commander , MD, [id number] Age: 72 Referring MD:  Date of Birth: 1951/09/27 Gender: Male Account #: [id number] Procedure:                Colonoscopy Indications:              High risk colon cancer surveillance: Personal                            history of colonic polyps - colonic polyposis of                            uncertain etiology, Last colonoscopy: 2023 Medicines:                Monitored Anesthesia Care Procedure:                Pre-Anesthesia Assessment:                           - Prior to the procedure, a History and Physical                            was performed, and patient medications and                            allergies were reviewed. The patient's tolerance of                            previous anesthesia was also reviewed. The risks                            and benefits of the procedure and the sedation                            options and risks were discussed with the patient.                            All questions were answered, and informed consent                            was obtained. Prior Anticoagulants: The patient has                            taken no anticoagulant or antiplatelet agents. ASA                            Grade Assessment: II - A patient with mild systemic                            disease. After reviewing the risks and benefits,                            the patient was deemed in satisfactory condition to  undergo the procedure.                           After obtaining informed consent, the colonoscope                            was passed under direct vision. Throughout the                            procedure, the patient's blood pressure, pulse, and                            oxygen saturations were monitored continuously. The                            Olympus Scope SN:  [id number] was introduced through                            the anus and advanced to the the cecum, identified                            by appendiceal orifice and ileocecal valve. The                            colonoscopy was performed without difficulty. The                            patient tolerated the procedure well. The quality                            of the bowel preparation was good. The ileocecal                            valve, appendiceal orifice, and rectum were                            photographed. The bowel preparation used was                            Miralax via split dose instruction. Scope In: 11:13:39 AM Scope Out: 11:33:37 AM Scope Withdrawal Time: 0 hours 15 minutes 32 seconds  Total Procedure Duration: 0 hours 19 minutes 58 seconds  Findings:                 Skin tags were found on perianal exam. Digital                            rectal normal including prostate.                           Two sessile polyps were found in the proximal                            sigmoid colon. The polyps were 5 to 8 mm in size.  These polyps were removed with a cold snare.                            Resection and retrieval were complete. Verification                            of patient identification for the specimen was                            done. Estimated blood loss was minimal.                           Multiple large-mouthed and small-mouthed                            diverticula were found in the sigmoid colon,                            descending colon and ascending colon.                           Anal papilla(e) were hypertrophied.                           The exam was otherwise without abnormality on                            direct and retroflexion views. Complications:            No immediate complications. Estimated Blood Loss:     Estimated blood loss was minimal. Impression:               - Perianal skin tags found on  perianal exam.                            Digital rectal exam normal including prostate.                           - Two 5 to 8 mm polyps in the proximal sigmoid                            colon, removed with a cold snare. Resected and                            retrieved.                           - Diverticulosis in the sigmoid colon, in the                            descending colon and in the ascending colon.                           - Anal papilla(e) were hypertrophied.                           -  The examination was otherwise normal on direct                            and retroflexion views.                           - Personal history of colonic polyps - colonic                            polyposis of uncertain etiology.                           - 12/23/2013 7 polyps removed, max 15 mm and all                            adenomas                           12/2019 14 polyps - tubular adenomas and 1 TV                            adenoma-                           2/23 4 small adenomas Recommendation:           - Patient has a contact number available for                            emergencies. The signs and symptoms of potential                            delayed complications were discussed with the                            patient. Return to normal activities tomorrow.                            Written discharge instructions were provided to the                            patient.                           - Resume previous diet.                           - Continue present medications.                           - Repeat colonoscopy is recommended. The                            colonoscopy date will be determined after pathology                            results from today's exam become available for  review. Lupita FORBES Commander, MD 06/24/2024 11:44:18 AM This report has been signed electronically.

## 2024-06-24 NOTE — Progress Notes (Signed)
 Called to room to assist during endoscopic procedure.  Patient ID and intended procedure confirmed with present staff. Received instructions for my participation in the procedure from the performing physician.

## 2024-06-27 ENCOUNTER — Telehealth: Payer: Self-pay

## 2024-06-27 NOTE — Telephone Encounter (Signed)
  Follow up Call-     06/24/2024   10:39 AM 12/26/2021    1:10 PM  Call back number  Post procedure Call Back phone  # [phone]  Permission to leave phone message Yes Yes     Left message

## 2024-06-29 LAB — SURGICAL PATHOLOGY

## 2024-07-01 ENCOUNTER — Ambulatory Visit: Payer: Self-pay | Admitting: Internal Medicine

## 2024-07-01 DIAGNOSIS — D126 Benign neoplasm of colon, unspecified: Secondary | ICD-10-CM

## 2024-10-07 DIAGNOSIS — H52203 Unspecified astigmatism, bilateral: Secondary | ICD-10-CM | POA: Diagnosis not present

## 2024-10-11 DIAGNOSIS — C44319 Basal cell carcinoma of skin of other parts of face: Secondary | ICD-10-CM | POA: Diagnosis not present

## 2024-10-12 DIAGNOSIS — K080 Exfoliation of teeth due to systemic causes: Secondary | ICD-10-CM | POA: Diagnosis not present
